# Patient Record
Sex: Female | Born: 1990 | Hispanic: Yes | Marital: Single | Smoking: Never smoker
Health system: Southern US, Community
[De-identification: ages and names within clinical notes are randomized; demographics above are authoritative.]

## PROBLEM LIST (undated history)

## (undated) ENCOUNTER — Emergency Department: Admission: EM | Discharge status: Absent without leave | Payer: PRIVATE HEALTH INSURANCE

## (undated) DIAGNOSIS — E282 Polycystic ovarian syndrome: Secondary | ICD-10-CM

## (undated) DIAGNOSIS — Z789 Other specified health status: Secondary | ICD-10-CM

## (undated) HISTORY — DX: Polycystic ovarian syndrome: E28.2

## (undated) HISTORY — PX: NO PAST SURGERIES: SHX2092

---

## 2012-06-02 ENCOUNTER — Ambulatory Visit: Payer: Self-pay | Admitting: Primary Care

## 2014-07-22 LAB — HM PAP SMEAR

## 2014-12-10 ENCOUNTER — Other Ambulatory Visit: Payer: Self-pay | Admitting: Physician Assistant

## 2014-12-10 DIAGNOSIS — Z3491 Encounter for supervision of normal pregnancy, unspecified, first trimester: Secondary | ICD-10-CM

## 2014-12-17 ENCOUNTER — Ambulatory Visit
Admission: RE | Admit: 2014-12-17 | Discharge: 2014-12-17 | Disposition: A | Discharge status: Home / Self Care | Payer: Managed Care, Other (non HMO) | Source: Ambulatory Visit | Attending: Physician Assistant | Admitting: Physician Assistant

## 2014-12-17 DIAGNOSIS — Z36 Encounter for antenatal screening of mother: Secondary | ICD-10-CM | POA: Diagnosis present

## 2014-12-17 DIAGNOSIS — Z3491 Encounter for supervision of normal pregnancy, unspecified, first trimester: Secondary | ICD-10-CM

## 2015-02-05 ENCOUNTER — Other Ambulatory Visit: Payer: Self-pay | Admitting: Primary Care

## 2015-02-05 DIAGNOSIS — Z3492 Encounter for supervision of normal pregnancy, unspecified, second trimester: Secondary | ICD-10-CM

## 2015-02-26 ENCOUNTER — Ambulatory Visit
Admission: RE | Admit: 2015-02-26 | Discharge: 2015-02-26 | Disposition: A | Discharge status: Home / Self Care | Payer: Managed Care, Other (non HMO) | Source: Ambulatory Visit | Attending: Primary Care | Admitting: Primary Care

## 2015-02-26 DIAGNOSIS — Z3492 Encounter for supervision of normal pregnancy, unspecified, second trimester: Secondary | ICD-10-CM

## 2015-02-26 DIAGNOSIS — Z36 Encounter for antenatal screening of mother: Secondary | ICD-10-CM | POA: Insufficient documentation

## 2015-06-22 NOTE — L&D Delivery Note (Signed)
Delivery Note At 10:47 AM a viable female was delivered via Vaginal, Spontaneous Delivery (Presentation:LOA ;  Nursing student Sandi Mariscal assisted  ).  APGAR: 8, 9; weight  .   Placenta status: Intact, Spontaneous.  Cord:delayed clamping   with the following complications: None.   Anesthesia: None  Episiotomy: none   Lacerations:Bilateral  Labial+2nd degree Suture Repair: 2.0 3.0 vicryl Est. Blood Loss (mL):  200  Mom to postpartum.  Baby to Couplet care / Skin to Skin.  Mane Consolo 07/19/2015, 11:20 AM

## 2015-07-19 ENCOUNTER — Encounter: Payer: Self-pay | Admitting: *Deleted

## 2015-07-19 ENCOUNTER — Inpatient Hospital Stay
Admission: EM | Admit: 2015-07-19 | Discharge: 2015-07-20 | DRG: 775 | Disposition: A | Discharge status: Home / Self Care | Payer: Managed Care, Other (non HMO) | Attending: Obstetrics and Gynecology | Admitting: Obstetrics and Gynecology

## 2015-07-19 DIAGNOSIS — Z3490 Encounter for supervision of normal pregnancy, unspecified, unspecified trimester: Secondary | ICD-10-CM

## 2015-07-19 DIAGNOSIS — O480 Post-term pregnancy: Secondary | ICD-10-CM | POA: Diagnosis present

## 2015-07-19 DIAGNOSIS — Z3A41 41 weeks gestation of pregnancy: Secondary | ICD-10-CM | POA: Diagnosis not present

## 2015-07-19 LAB — TYPE AND SCREEN
ABO/RH(D): B POS
ANTIBODY SCREEN: NEGATIVE

## 2015-07-19 LAB — CBC
HCT: 36.2 % (ref 35.0–47.0)
Hemoglobin: 12.2 g/dL (ref 12.0–16.0)
MCH: 29.4 pg (ref 26.0–34.0)
MCHC: 33.6 g/dL (ref 32.0–36.0)
MCV: 87.6 fL (ref 80.0–100.0)
Platelets: 213 10*3/uL (ref 150–440)
RBC: 4.13 MIL/uL (ref 3.80–5.20)
RDW: 15.3 % — ABNORMAL HIGH (ref 11.5–14.5)
WBC: 11 10*3/uL (ref 3.6–11.0)

## 2015-07-19 LAB — ABO/RH: ABO/RH(D): B POS

## 2015-07-19 LAB — CHLAMYDIA/NGC RT PCR (ARMC ONLY)
Chlamydia Tr: NOT DETECTED
N gonorrhoeae: NOT DETECTED

## 2015-07-19 MED ORDER — PENICILLIN G POTASSIUM 5000000 UNITS IJ SOLR
5.0000 10*6.[IU] | Freq: Once | INTRAVENOUS | Status: DC
  Filled 2015-07-19: qty 5

## 2015-07-19 MED ORDER — PRENATAL MULTIVITAMIN CH
1.0000 | ORAL_TABLET | Freq: Every day | ORAL | Status: DC
  Administered 2015-07-19 – 2015-07-20 (×2): 1 via ORAL
  Filled 2015-07-19 (×2): qty 1

## 2015-07-19 MED ORDER — WITCH HAZEL-GLYCERIN EX PADS: 1.0000 "application " | MEDICATED_PAD | CUTANEOUS | Status: DC | PRN

## 2015-07-19 MED ORDER — BENZOCAINE-MENTHOL 20-0.5 % EX AERO
1.0000 "application " | INHALATION_SPRAY | CUTANEOUS | Status: DC | PRN
  Filled 2015-07-19: qty 56

## 2015-07-19 MED ORDER — ONDANSETRON HCL 4 MG PO TABS: 4.0000 mg | ORAL_TABLET | ORAL | Status: DC | PRN

## 2015-07-19 MED ORDER — LIDOCAINE HCL (PF) 1 % IJ SOLN
30.0000 mL | INTRAMUSCULAR | Status: DC | PRN
  Filled 2015-07-19: qty 30

## 2015-07-19 MED ORDER — IBUPROFEN 600 MG PO TABS
600.0000 mg | ORAL_TABLET | Freq: Four times a day (QID) | ORAL | Status: DC
Start: 2015-07-19 — End: 2015-07-20
  Administered 2015-07-19 – 2015-07-20 (×4): 600 mg via ORAL
  Filled 2015-07-19 (×4): qty 1

## 2015-07-19 MED ORDER — ACETAMINOPHEN 325 MG PO TABS: 650.0000 mg | ORAL_TABLET | ORAL | Status: DC | PRN

## 2015-07-19 MED ORDER — SIMETHICONE 80 MG PO CHEW: 80.0000 mg | CHEWABLE_TABLET | ORAL | Status: DC | PRN

## 2015-07-19 MED ORDER — SENNOSIDES-DOCUSATE SODIUM 8.6-50 MG PO TABS
2.0000 | ORAL_TABLET | ORAL | Status: DC
  Administered 2015-07-20: 2 via ORAL
  Filled 2015-07-19: qty 2

## 2015-07-19 MED ORDER — PENICILLIN G POTASSIUM 5000000 UNITS IJ SOLR
2.5000 10*6.[IU] | INTRAMUSCULAR | Status: DC
  Filled 2015-07-19 (×5): qty 2.5

## 2015-07-19 MED ORDER — CITRIC ACID-SODIUM CITRATE 334-500 MG/5ML PO SOLN: 30.0000 mL | ORAL | Status: DC | PRN

## 2015-07-19 MED ORDER — OXYTOCIN 10 UNIT/ML IJ SOLN
INTRAMUSCULAR | Status: AC
  Filled 2015-07-19: qty 2

## 2015-07-19 MED ORDER — OXYTOCIN 40 UNITS IN LACTATED RINGERS INFUSION - SIMPLE MED
2.5000 [IU]/h | INTRAVENOUS | Status: DC
  Filled 2015-07-19: qty 1000

## 2015-07-19 MED ORDER — OXYTOCIN BOLUS FROM INFUSION
500.0000 mL | INTRAVENOUS | Status: DC
  Administered 2015-07-19: 500 mL via INTRAVENOUS

## 2015-07-19 MED ORDER — ONDANSETRON HCL 4 MG/2ML IJ SOLN: 4.0000 mg | INTRAMUSCULAR | Status: DC | PRN

## 2015-07-19 MED ORDER — BUTORPHANOL TARTRATE 1 MG/ML IJ SOLN: 1.0000 mg | INTRAMUSCULAR | Status: DC | PRN

## 2015-07-19 MED ORDER — FERROUS SULFATE 325 (65 FE) MG PO TABS
325.0000 mg | ORAL_TABLET | Freq: Two times a day (BID) | ORAL | Status: DC
Start: 2015-07-19 — End: 2015-07-20
  Administered 2015-07-20: 325 mg via ORAL
  Filled 2015-07-19: qty 1

## 2015-07-19 MED ORDER — MEASLES, MUMPS & RUBELLA VAC ~~LOC~~ INJ
0.5000 mL | INJECTION | Freq: Once | SUBCUTANEOUS | Status: AC
  Administered 2015-07-20: 0.5 mL via SUBCUTANEOUS
  Filled 2015-07-19 (×2): qty 0.5

## 2015-07-19 MED ORDER — MAGNESIUM HYDROXIDE 400 MG/5ML PO SUSP: 30.0000 mL | ORAL | Status: DC | PRN

## 2015-07-19 MED ORDER — LACTATED RINGERS IV SOLN: 500.0000 mL | INTRAVENOUS | Status: DC | PRN

## 2015-07-19 MED ORDER — LANOLIN HYDROUS EX OINT: TOPICAL_OINTMENT | CUTANEOUS | Status: DC | PRN

## 2015-07-19 MED ORDER — MISOPROSTOL 200 MCG PO TABS
ORAL_TABLET | ORAL | Status: AC
  Filled 2015-07-19: qty 4

## 2015-07-19 MED ORDER — LACTATED RINGERS IV SOLN
INTRAVENOUS | Status: DC
Start: 2015-07-19 — End: 2015-07-19

## 2015-07-19 MED ORDER — ZOLPIDEM TARTRATE 5 MG PO TABS: 5.0000 mg | ORAL_TABLET | Freq: Every evening | ORAL | Status: DC | PRN

## 2015-07-19 MED ORDER — OXYCODONE HCL 5 MG PO TABS: 10.0000 mg | ORAL_TABLET | ORAL | Status: DC | PRN

## 2015-07-19 MED ORDER — AMMONIA AROMATIC IN INHA
RESPIRATORY_TRACT | Status: AC
  Filled 2015-07-19: qty 10

## 2015-07-19 MED ORDER — DIBUCAINE 1 % RE OINT: 1.0000 "application " | TOPICAL_OINTMENT | RECTAL | Status: DC | PRN

## 2015-07-19 MED ORDER — ONDANSETRON HCL 4 MG/2ML IJ SOLN: 4.0000 mg | Freq: Four times a day (QID) | INTRAMUSCULAR | Status: DC | PRN

## 2015-07-19 MED ORDER — DIPHENHYDRAMINE HCL 25 MG PO CAPS: 25.0000 mg | ORAL_CAPSULE | Freq: Four times a day (QID) | ORAL | Status: DC | PRN

## 2015-07-19 MED ORDER — LIDOCAINE HCL (PF) 1 % IJ SOLN
INTRAMUSCULAR | Status: AC
  Filled 2015-07-19: qty 30

## 2015-07-19 MED ORDER — OXYCODONE HCL 5 MG PO TABS: 5.0000 mg | ORAL_TABLET | ORAL | Status: DC | PRN

## 2015-07-19 NOTE — Progress Notes (Signed)
Rebekah Brown is a 25 y.o. G1P0 at [redacted]w[redacted]d by LMP admitted for active labor  Subjective:  Painful ctx Objective: BP 113/74 mmHg  Pulse 94  Temp(Src) 98.2 F (36.8 C) (Oral)  Resp 14  Ht 5' (1.524 m)  Wt 169 lb (76.658 kg)  BMI 33.01 kg/m2  LMP 10/07/2014 I/O last 3 completed shifts: In: 125 [I.V.:125] Out: -  Total I/O In: 125 [I.V.:125] Out: -   FHT:  Reassuring fetal monitoring UC:   none SVE:   Dilation: 8 Effacement (%): 90 Station: 0, -1 Exam by:: JPB, LSE  Labs: Lab Results  Component Value Date   WBC 11.0 07/19/2015   HGB 12.2 07/19/2015   HCT 36.2 07/19/2015   MCV 87.6 07/19/2015   PLT 213 07/19/2015    Assessment / Plan: Spontaneous labor, progressing normally  Labor: Progressing normally Preeclampsia:  n/a Fetal Wellbeing:  Category I Pain Control:  stadol prn  I/D:  n/a Anticipated MOD:  NSVD  SCHERMERHORN,THOMAS 07/19/2015, 10:39 AM

## 2015-07-19 NOTE — H&P (Signed)
Chala Gul is a 25 y.o. female presenting for labor , CTX since 1500 . EDC 07/14/15. No ROM . No vaginal bleeding. History OB History    Gravida Para Term Preterm AB TAB SAB Ectopic Multiple Living   1              History reviewed. No pertinent past medical history. History reviewed. No pertinent past surgical history. Family History: family history is not on file. Social History:  reports that she has never smoked. She does not have any smokeless tobacco history on file. She reports that she does not drink alcohol or use illicit drugs.   Prenatal Transfer Tool  Maternal Diabetes: No Genetic Screening: no records Maternal Ultrasounds/Referrals: Normal Fetal Ultrasounds or other Referrals:  Other:  Maternal Substance Abuse:  No Significant Maternal Medications:  None Significant Maternal Lab Results:  Lab values include: Group B Strep negative By RN report Other Comments:  None  ROS    Blood pressure 127/82, pulse 101, temperature 98.1 F (36.7 C), temperature source Oral, resp. rate 16, height 5' (1.524 m), weight 169 lb (76.658 kg). Exam VSS Lungs CTA  CV RRR  abd gravid  cx 5 cm / 90 /0 vtx AROM clear FSE placed  Physical Exam  Prenatal labs: ABO, Rh:  B+ Antibody:  neg  Rubella:  Immune RPR:   Neg  HBsAg:    HIV:    GBS:   Neg , By RN report  Fetal monitoring : reassuring . CTX q 2-3 minutes   Assessment/Plan: Labor  Reassuring fetal monitoring  Anticipate SVD Stadol prn pain  Offered CLE    Ryleeann Urquiza 07/19/2015, 4:24 AM

## 2015-07-20 LAB — CBC
HEMATOCRIT: 31.8 % — AB (ref 35.0–47.0)
HEMOGLOBIN: 10.4 g/dL — AB (ref 12.0–16.0)
MCH: 29.2 pg (ref 26.0–34.0)
MCHC: 32.8 g/dL (ref 32.0–36.0)
MCV: 89 fL (ref 80.0–100.0)
Platelets: 183 10*3/uL (ref 150–440)
RBC: 3.58 MIL/uL — AB (ref 3.80–5.20)
RDW: 15.4 % — AB (ref 11.5–14.5)
WBC: 11.2 10*3/uL — AB (ref 3.6–11.0)

## 2015-07-20 LAB — RPR: RPR Ser Ql: NONREACTIVE

## 2015-07-20 MED ORDER — FERROUS SULFATE 325 (65 FE) MG PO TABS: 325.0000 mg | ORAL_TABLET | Freq: Two times a day (BID) | ORAL | Status: DC

## 2015-07-20 NOTE — Progress Notes (Signed)
Post Partum Day 1 from NSVD, uncomplicated Subjective: no complaints  Objective: Blood pressure 109/68, pulse 69, temperature 97.6 F (36.4 C), temperature source Oral, resp. rate 16, height 5' (1.524 m), weight 76.658 kg (169 lb), last menstrual period 10/07/2014, SpO2 100 %.  Physical Exam:  General: alert, cooperative and no distress Lochia: appropriate Uterine Fundus: firm Incision: none DVT Evaluation: No evidence of DVT seen on physical exam.   Recent Labs  07/19/15 0411 07/20/15 0715  HGB 12.2 10.4*  HCT 36.2 31.8*    Assessment/Plan: Discharge home and Breastfeeding   LOS: 1 day   Rebekah Brown 07/20/2015, 9:10 AM

## 2015-07-20 NOTE — Discharge Summary (Signed)
Obstetric Discharge Summary Reason for Admission: onset of labor Intrapartum Procedures: spontaneous vaginal delivery Complications-Operative and Postpartum: none HEMOGLOBIN  Date Value Ref Range Status  07/20/2015 10.4* 12.0 - 16.0 g/dL Final   HCT  Date Value Ref Range Status  07/20/2015 31.8* 35.0 - 47.0 % Final    Physical Exam:  General: alert, cooperative and no distress Lochia: appropriate Uterine Fundus: firm Incision: none DVT Evaluation: No evidence of DVT seen on physical exam.  Discharge Diagnoses: Term Pregnancy-delivered  Discharge Information: Date: 07/20/2015 Activity: pelvic rest Diet: routine Medications: PNV and Ibuprofen Condition: stable Instructions: refer to practice specific booklet Discharge to: home Follow-up Information    Follow up with SCHERMERHORN,THOMAS, MD In 6 weeks.   Specialty:  Obstetrics and Gynecology   Why:  For postpartum visit   Contact information:   [address] [place] [address] [phone]       Newborn Data: Live born female  Birth Weight: 7 lb 14.6 oz (3590 g) APGAR: 8, 9  Home with mother.  Christeen Douglas 07/20/2015, 11:38 AM

## 2015-07-20 NOTE — Discharge Instructions (Signed)
Care After Vaginal Delivery °Congratulations on your new baby!! ° °Refer to this sheet in the next few weeks. These discharge instructions provide you with information on caring for yourself after delivery. Your caregiver may also give you specific instructions. Your treatment has been planned according to the most current medical practices available, but problems sometimes occur. Call your caregiver if you have any problems or questions after you go home. ° °HOME CARE INSTRUCTIONS °· Take over-the-counter or prescription medicines only as directed by your caregiver or pharmacist. °· Do not drink alcohol, especially if you are breastfeeding or taking medicine to relieve pain. °· Do not chew or smoke tobacco. °· Do not use illegal drugs. °· Continue to use good perineal care. Good perineal care includes: °¨ Wiping your perineum from front to back. °¨ Keeping your perineum clean. °· Do not use tampons or douche until your caregiver says it is okay. °· Shower, wash your hair, and take tub baths as directed by your caregiver. °· Wear a well-fitting bra that provides breast support. °· Eat healthy foods. °· Drink enough fluids to keep your urine clear or pale yellow. °· Eat high-fiber foods such as whole grain cereals and breads, brown rice, beans, and fresh fruits and vegetables every day. These foods may help prevent or relieve constipation. °· Follow your caregiver's recommendations regarding resumption of activities such as climbing stairs, driving, lifting, exercising, or traveling. Specifically, no driving for two weeks, so that you are comfortable reacting quickly in an emergency. °· Talk to your caregiver about resuming sexual activities. Resumption of sexual activities is dependent upon your risk of infection, your rate of healing, and your comfort and desire to resume sexual activity. Usually we recommend waiting about six weeks, or until your bleeding stops and you are interested in sex. °· Try to have someone  help you with your household activities and your newborn for at least a few days after you leave the hospital. Even longer is better. °· Rest as much as possible. Try to rest or take a nap when your newborn is sleeping. Sleep deprivation can be very hard after delivery. °· Increase your activities gradually. °· Keep all of your scheduled postpartum appointments. It is very important to keep your scheduled follow-up appointments. At these appointments, your caregiver will be checking to make sure that you are healing physically and emotionally. ° °SEEK MEDICAL CARE IF:  °· You are passing large clots from your vagina.  °· You have a foul smelling discharge from your vagina. °· You have trouble urinating. °· You are urinating frequently. °· You have pain when you urinate. °· You have a change in your bowel movements. °· You have increasing redness, pain, or swelling near your vaginal incision (episiotomy) or vaginal tear. °· You have pus draining from your episiotomy or vaginal tear. °· Your episiotomy or vaginal tear is separating. °· You have painful, hard, or reddened breasts. °· You have a severe headache. °· You have blurred vision or see spots. °· You feel sad or depressed. °· You have thoughts of hurting yourself or your newborn. °· You have questions about your care, the care of your newborn, or medicines. °· You are dizzy or light-headed. °· You have a rash. °· You have nausea or vomiting. °· You were breastfeeding and have not had a menstrual period within 12 weeks after you stopped breastfeeding. °· You are not breastfeeding and have not had a menstrual period by the 12th week after delivery. °· You   have a fever. ° °SEEK IMMEDIATE MEDICAL CARE IF:  °· You have persistent pain. °· You have chest pain. °· You have shortness of breath. °· You faint. °· You have leg pain. °· You have stomach pain. °· Your vaginal bleeding saturates two or more sanitary pads in 1 hour. ° °MAKE SURE YOU:  °· Understand these  instructions. °· Will get help right away if you are not doing well or get worse. °·  °Document Released: 06/04/2000 Document Revised: 10/22/2013 Document Reviewed: 02/02/2012 ° °ExitCare® Patient Information ©2015 [employer]. This information is not intended to replace advice given to you by your health care provider. Make sure you discuss any questions you have with your health care provider. ° °

## 2015-07-20 NOTE — Progress Notes (Signed)
Patient discharge to home ambulatory.  Discharge paper work reviewed with patient.

## 2015-11-09 IMAGING — US US OB COMP LESS 14 WK
1 series · 14 of 28 positions shown · non-contrast
Comparison: None.

CLINICAL DATA: Unknown dates.

EXAM:
OBSTETRIC <14 WK US AND TRANSVAGINAL OB US
TECHNIQUE: Both transabdominal and transvaginal ultrasound examinations were
performed for complete evaluation of the gestation as well as the
maternal uterus, adnexal regions, and pelvic cul-de-sac.
Transvaginal technique was performed to assess early pregnancy.

[Series 1: us ob comp less 14 wk · 0.26mm/px · 14 of 44 slices shown]
[im 2/44]
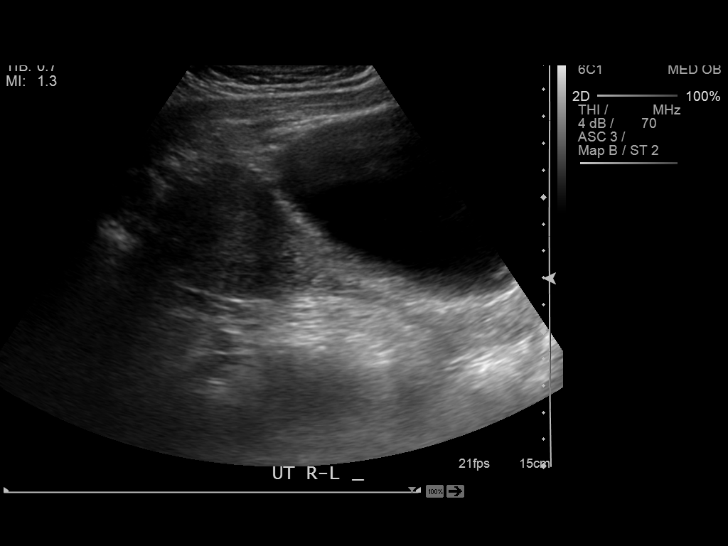
[im 5/44]
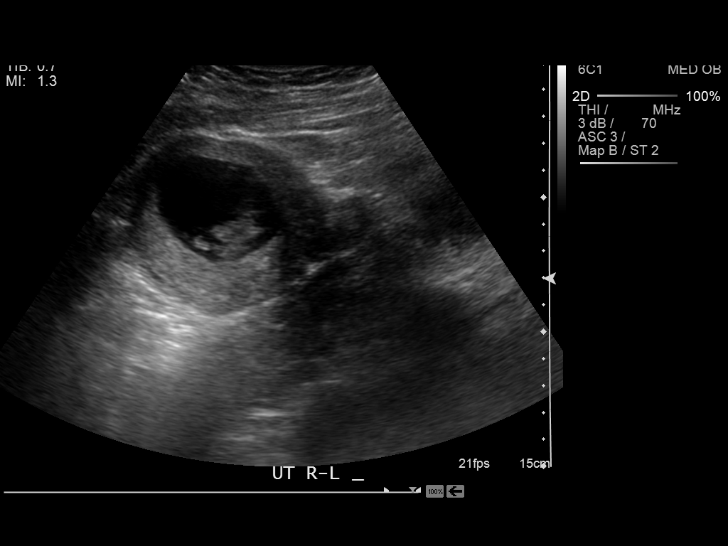
[im 8/44]
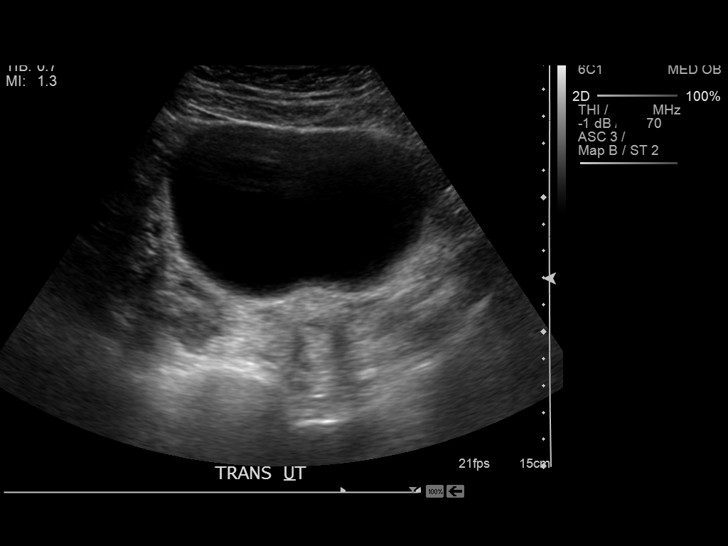
[im 12/44]
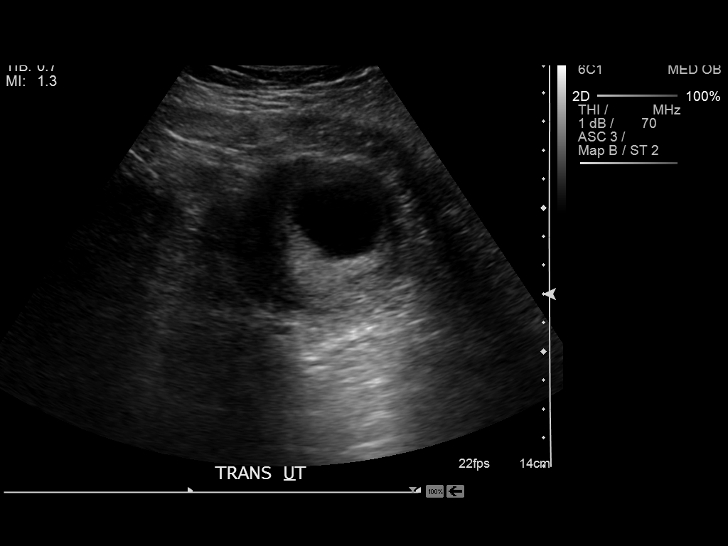
[im 15/44]
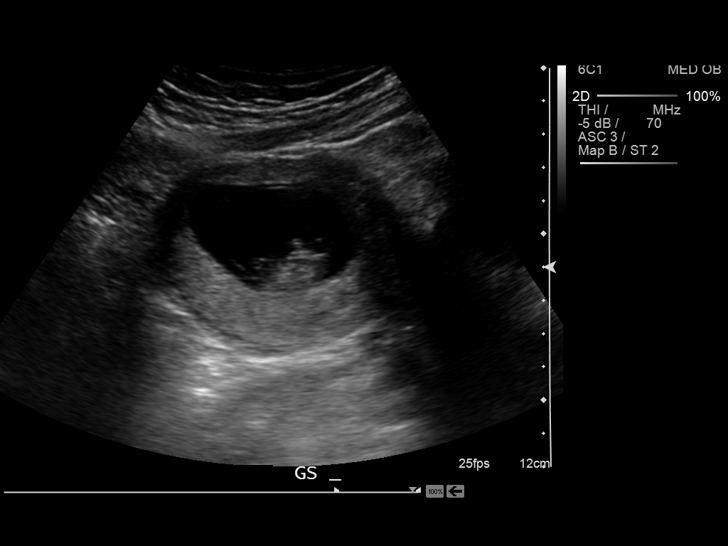
[im 18/44]
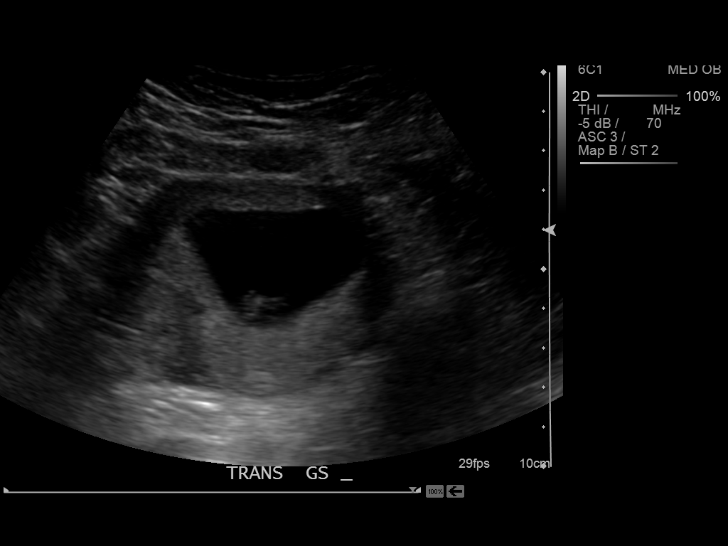
[im 21/44]
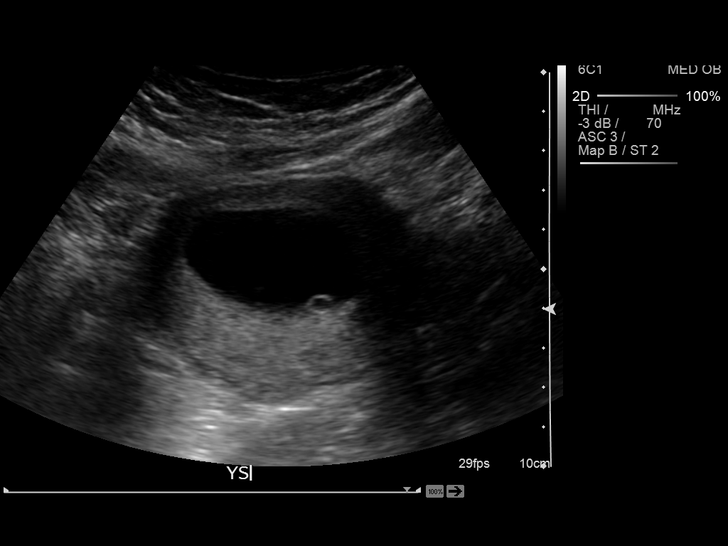
[im 24/44]
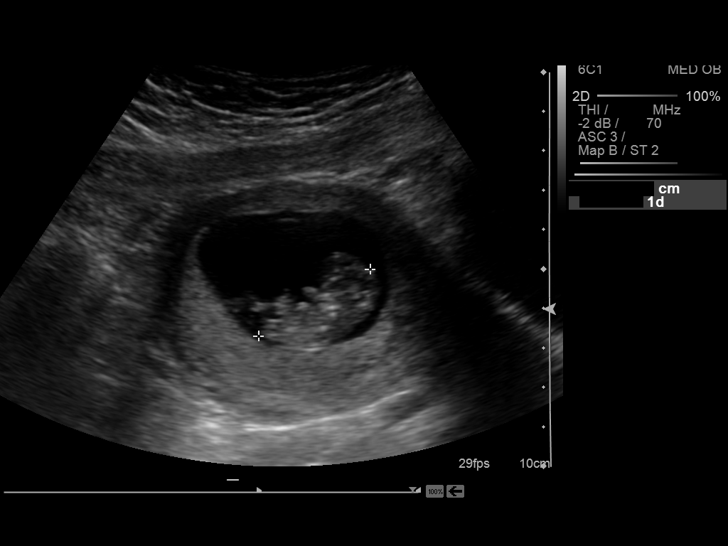
[im 28/44]
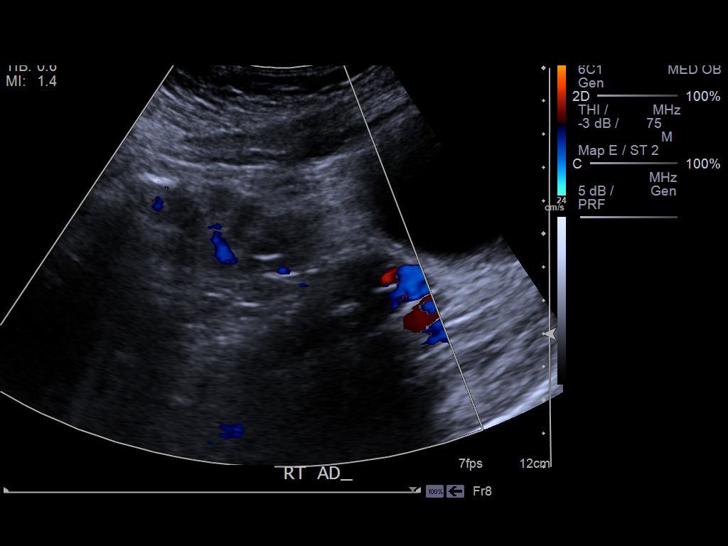
[im 31/44]
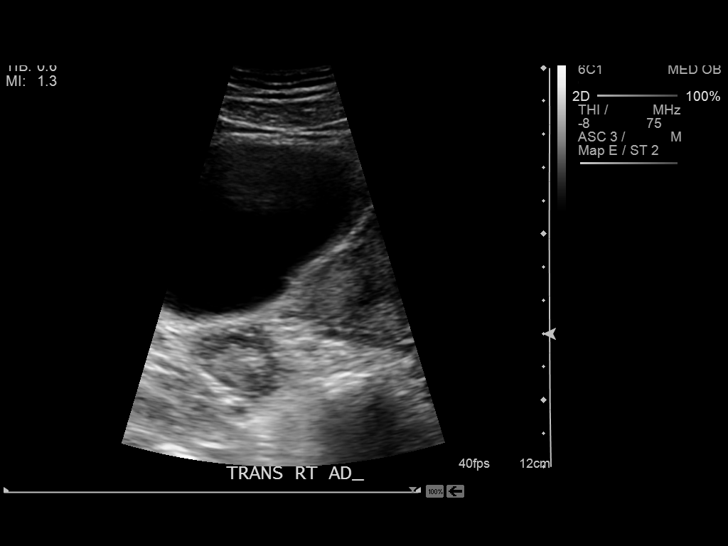
[im 34/44]
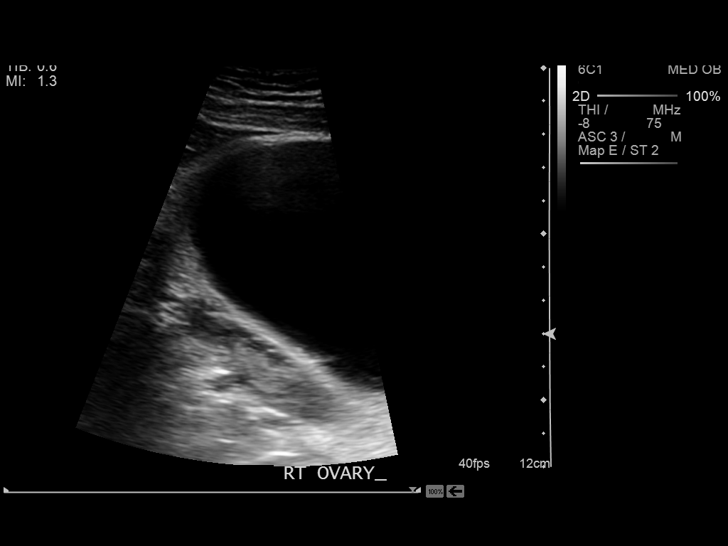
[im 37/44]
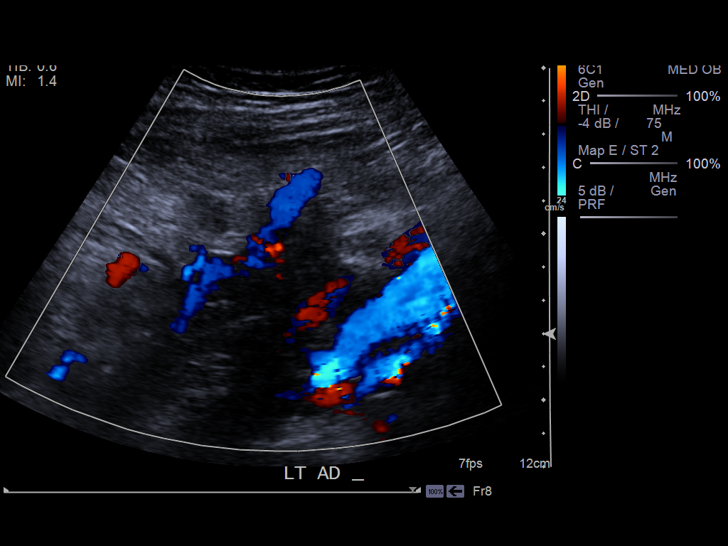
[im 40/44]
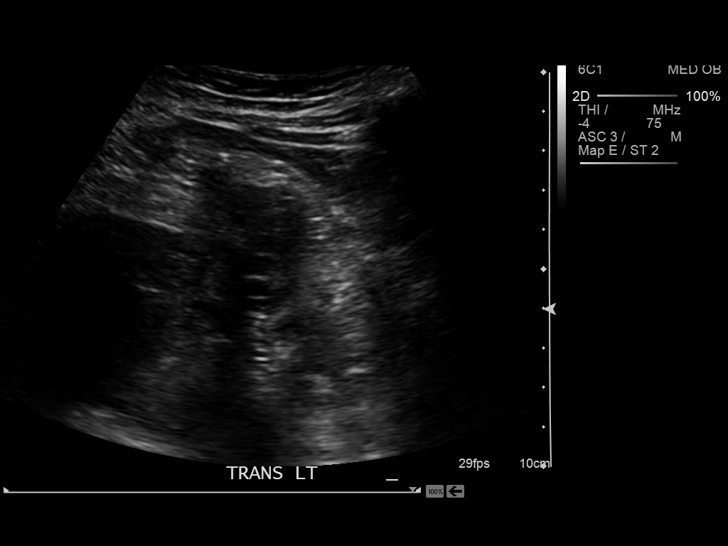
[im 44/44]
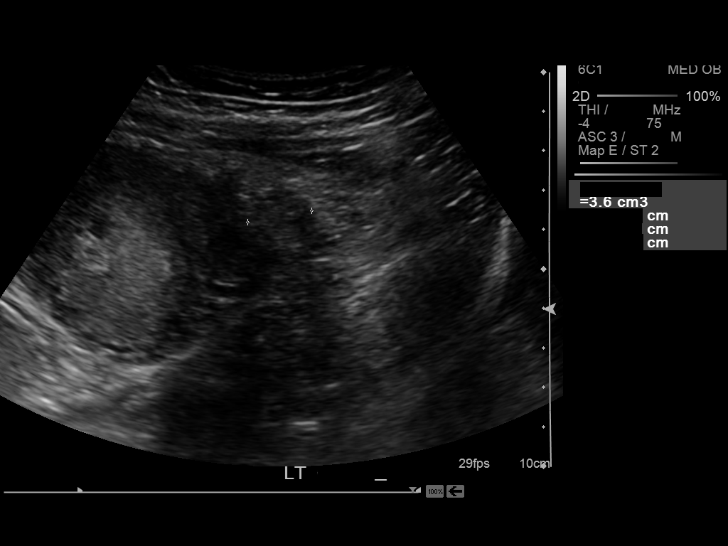

[14 of 28 positions shown; findings below may reference images not displayed]

FINDINGS: Intrauterine gestational sac: Single.

Yolk sac:  Yes

Embryo:  Yes

Cardiac Activity: Yes

Heart Rate: 169  bpm

CRL:  33  mm   10 w   1 d                  US EDC: 07/14/2015

Maternal uterus/adnexae: No subchorionic hemorrhage. Normal ovaries.
IMPRESSION: Single viable intrauterine pregnancy of approximately 10 weeks 1 day
gestation.

## 2016-05-23 ENCOUNTER — Encounter (HOSPITAL_COMMUNITY): Payer: Self-pay

## 2018-07-07 ENCOUNTER — Ambulatory Visit (INDEPENDENT_AMBULATORY_CARE_PROVIDER_SITE_OTHER): Payer: No Typology Code available for payment source | Admitting: Obstetrics and Gynecology

## 2018-07-07 ENCOUNTER — Other Ambulatory Visit (HOSPITAL_COMMUNITY)
Admission: RE | Admit: 2018-07-07 | Discharge: 2018-07-07 | Disposition: A | Discharge status: Home / Self Care | Payer: No Typology Code available for payment source | Source: Ambulatory Visit | Attending: Obstetrics and Gynecology | Admitting: Obstetrics and Gynecology

## 2018-07-07 ENCOUNTER — Encounter: Payer: Self-pay | Admitting: Obstetrics and Gynecology

## 2018-07-07 VITALS — BP 100/70 | HR 72 | Ht 60.0 in | Wt 135.0 lb

## 2018-07-07 DIAGNOSIS — N870 Mild cervical dysplasia: Secondary | ICD-10-CM

## 2018-07-07 DIAGNOSIS — R87610 Atypical squamous cells of undetermined significance on cytologic smear of cervix (ASC-US): Secondary | ICD-10-CM

## 2018-07-07 DIAGNOSIS — R87810 Cervical high risk human papillomavirus (HPV) DNA test positive: Secondary | ICD-10-CM | POA: Insufficient documentation

## 2018-07-07 NOTE — Progress Notes (Signed)
   Referring Provider:  [facility], Sandrea HughsJessica Rubio, FNP for ASCUS, HPV + pap smear  HPI:  Enid CutterDiana Viscomi is a 28 y.o.  G1P1001  who presents today for evaluation and management of abnormal cervical cytology.    Dysplasia History:  ASCUS, HPV + pap smear - 05/2018  OB History  Gravida Para Term Preterm AB Living  1 1 1     1   SAB TAB Ectopic Multiple Live Births          1    # Outcome Date GA Lbr Len/2nd Weight Sex Delivery Anes PTL Lv  1 Term             Past Medical History:  Diagnosis Date  . Polycystic ovaries     History reviewed. No pertinent surgical history.  SOCIAL HISTORY:  Social History   Substance and Sexual Activity  Alcohol Use No    Social History   Substance and Sexual Activity  Drug Use No     History reviewed. No pertinent family history.  ALLERGIES:  Patient has no known allergies.  No current outpatient medications on file prior to visit.   No current facility-administered medications on file prior to visit.     Physical Exam: -Vitals:  BP 100/70   Pulse 72   Ht 5' (1.524 m)   Wt 135 lb (61.2 kg)   Breastfeeding No   BMI 26.37 kg/m  GEN: WD, WN, NAD.  A+ O x 3, good mood and affect. ABD:  NT, ND.  Soft, no masses.  No hernias noted.   Pelvic:   Vulva: Normal appearance.  No lesions.  Vagina: No lesions or abnormalities noted.  Support: Normal pelvic support.  Urethra No masses tenderness or scarring.  Meatus Normal size without lesions or prolapse.  Cervix: See below.  Anus: Normal exam.  No lesions.  Perineum: Normal exam.  No lesions.        Bimanual   Uterus: Normal size.  Non-tender.  Mobile.  AV.  Adnexae: No masses.  Non-tender to palpation.  Cul-de-sac: Negative for abnormality.   PROCEDURE: 1.  Urine Pregnancy Test:  not done, patient using Nexplanon for contraception 2.  Colposcopy performed with 4% acetic acid after verbal consent obtained                             -Aceto-white Lesions Location(s): mild diffusely               -Biopsy performed at 5, 8, and 12 o'clock               -ECC indicated and performed: Yes.       -Biopsy sites made hemostatic with pressure and Monsel's solution   -Satisfactory colposcopy: No.    -Evidence of Invasive cervical CA :  NO  ASSESSMENT:  Enid CutterDiana Berberich is a 28 y.o. G1P1001 here for  1. ASCUS with positive high risk HPV cervical   .  PLAN:  I discussed the grading system of pap smears and HPV high risk viral types.  We will discuss and base management after colpo results return.     Thomasene MohairStephen Sadonna Kotara, MD  [facility], [facility] 07/07/2018  3:16 PM   CC: Sandrea Hughsubio, Jessica, NP [address] [place], [place] [id number]

## 2018-07-13 ENCOUNTER — Encounter: Payer: Self-pay | Admitting: Obstetrics and Gynecology

## 2018-07-13 ENCOUNTER — Telehealth: Payer: Self-pay | Admitting: Obstetrics and Gynecology

## 2018-07-13 NOTE — Telephone Encounter (Signed)
Will send letter with results.

## 2018-07-20 ENCOUNTER — Telehealth: Payer: Self-pay

## 2018-07-20 NOTE — Telephone Encounter (Signed)
Pt calling for colpo results.  [phone]

## 2018-07-25 NOTE — Telephone Encounter (Signed)
Please let SDJ know when pt returns call

## 2018-07-25 NOTE — Telephone Encounter (Signed)
Unable to leave VM due to not being set up yet. I have sent her a letter with the results on 07/13/2018.  I called [phone].

## 2018-08-01 NOTE — Telephone Encounter (Signed)
Pt has not called back yet. Do you want to try again?

## 2019-06-19 ENCOUNTER — Telehealth: Payer: Self-pay | Admitting: Obstetrics and Gynecology

## 2019-06-19 NOTE — Telephone Encounter (Signed)
Rebekah Brown referring for Colpo Ascus pap, HPV+. Phone number's on file are not in service unable to leave message.

## 2019-07-04 ENCOUNTER — Ambulatory Visit: Payer: No Typology Code available for payment source

## 2019-07-09 ENCOUNTER — Other Ambulatory Visit (HOSPITAL_COMMUNITY)
Admission: RE | Admit: 2019-07-09 | Discharge: 2019-07-09 | Disposition: A | Discharge status: Home / Self Care | Payer: PRIVATE HEALTH INSURANCE | Source: Ambulatory Visit | Attending: Obstetrics & Gynecology | Admitting: Obstetrics & Gynecology

## 2019-07-09 ENCOUNTER — Ambulatory Visit (INDEPENDENT_AMBULATORY_CARE_PROVIDER_SITE_OTHER): Payer: No Typology Code available for payment source | Admitting: Obstetrics & Gynecology

## 2019-07-09 ENCOUNTER — Other Ambulatory Visit: Payer: Self-pay

## 2019-07-09 ENCOUNTER — Encounter: Payer: Self-pay | Admitting: Obstetrics & Gynecology

## 2019-07-09 VITALS — BP 120/80 | Ht 60.0 in | Wt 147.0 lb

## 2019-07-09 DIAGNOSIS — N870 Mild cervical dysplasia: Secondary | ICD-10-CM | POA: Insufficient documentation

## 2019-07-09 NOTE — Patient Instructions (Signed)

## 2019-07-09 NOTE — Progress Notes (Signed)
Referring Provider:  [facility]  HPI:  Ceria Suminski is a 29 y.o.  G1P1001  who presents today for evaluation and management of abnormal cervical cytology.    Dysplasia History:  ASCUS +HPV 05/2018 and 05/2019   Colpo Bx 06/2018 showed CIN I in 2 /4 biopsies  ROS:  Pertinent items are noted in HPI.  OB History  Gravida Para Term Preterm AB Living  1 1 1     1   SAB TAB Ectopic Multiple Live Births          1    # Outcome Date GA Lbr Len/2nd Weight Sex Delivery Anes PTL Lv  1 Term             Past Medical History:  Diagnosis Date  . Polycystic ovaries     History reviewed. No pertinent surgical history.  SOCIAL HISTORY: Social History   Substance and Sexual Activity  Alcohol Use No   Social History   Substance and Sexual Activity  Drug Use No     History reviewed. No pertinent family history.  ALLERGIES:  Patient has no known allergies.  No current outpatient medications on file prior to visit.   No current facility-administered medications on file prior to visit.    Physical Exam: -Vitals:  BP 120/80   Ht 5' (1.524 m)   Wt 147 lb (66.7 kg)   BMI 28.71 kg/m  GEN: WD, WN, NAD.  A+ O x 3, good mood and affect. ABD:  NT, ND.  Soft, no masses.  No hernias noted.   Pelvic:   Vulva: Normal appearance.  No lesions.  Vagina: No lesions or abnormalities noted.  Support: Normal pelvic support.  Urethra No masses tenderness or scarring.  Meatus Normal size without lesions or prolapse.  Cervix: See below.  Anus: Normal exam.  No lesions.  Perineum: Normal exam.  No lesions.        Bimanual   Uterus: Normal size.  Non-tender.  Mobile.  AV.  Adnexae: No masses.  Non-tender to palpation.  Cul-de-sac: Negative for abnormality.   PROCEDURE: 1.  Urine Pregnancy Test:  not done 2.  Colposcopy performed with 4% acetic acid after verbal consent obtained                                         -Aceto-white Lesions Location(s): 3 o'clock.    -Biopsy performed at 3 o'clock               -ECC indicated and performed: Yes.       -Biopsy sites made hemostatic with pressure, AgNO3, and/or Monsel's solution   -Satisfactory colposcopy: Yes.      -Evidence of Invasive cervical CA :  NO  ASSESSMENT:  Charlot Gouin is a 29 y.o. G1P1001 here for  1. CIN I (cervical intraepithelial neoplasia I)   .  PLAN: 1.  I discussed the grading system of pap smears and HPV high risk viral types.  We will discuss and base management after colpo results return. 2. Follow up PAP 6 months, vs intervention if high grade dysplasia identified 3. Treatment of persistantly abnormal PAP smears and cervical dysplasia, even mild, is discussed w pt today in detail, as well as the pros and cons of Cryo and LEEP procedures. Will consider and discuss after results.      Barnett Applebaum, MD, [facility], [facility] 07/09/2019  3:57 PM

## 2019-07-11 LAB — SURGICAL PATHOLOGY

## 2019-08-02 ENCOUNTER — Emergency Department
Admission: EM | Admit: 2019-08-02 | Discharge: 2019-08-02 | Disposition: A | Discharge status: Home / Self Care | Payer: Worker's Compensation | Attending: Student in an Organized Health Care Education/Training Program | Admitting: Student in an Organized Health Care Education/Training Program

## 2019-08-02 ENCOUNTER — Encounter: Payer: Self-pay | Admitting: Emergency Medicine

## 2019-08-02 ENCOUNTER — Emergency Department: Payer: No Typology Code available for payment source | Attending: Physician Assistant

## 2019-08-02 ENCOUNTER — Other Ambulatory Visit: Payer: Self-pay

## 2019-08-02 DIAGNOSIS — Y9289 Other specified places as the place of occurrence of the external cause: Secondary | ICD-10-CM | POA: Insufficient documentation

## 2019-08-02 DIAGNOSIS — Y9389 Activity, other specified: Secondary | ICD-10-CM | POA: Insufficient documentation

## 2019-08-02 DIAGNOSIS — S300XXA Contusion of lower back and pelvis, initial encounter: Secondary | ICD-10-CM | POA: Insufficient documentation

## 2019-08-02 DIAGNOSIS — Y990 Civilian activity done for income or pay: Secondary | ICD-10-CM | POA: Insufficient documentation

## 2019-08-02 DIAGNOSIS — S3992XA Unspecified injury of lower back, initial encounter: Secondary | ICD-10-CM | POA: Diagnosis present

## 2019-08-02 DIAGNOSIS — W010XXA Fall on same level from slipping, tripping and stumbling without subsequent striking against object, initial encounter: Secondary | ICD-10-CM | POA: Diagnosis not present

## 2019-08-02 LAB — POCT PREGNANCY, URINE: Preg Test, Ur: NEGATIVE

## 2019-08-02 MED ORDER — TRAMADOL HCL 50 MG PO TABS: 50.0000 mg | ORAL_TABLET | Freq: Four times a day (QID) | ORAL | 0 refills | Status: AC | PRN

## 2019-08-02 MED ORDER — LIDOCAINE 5 % EX PTCH
1.0000 | MEDICATED_PATCH | CUTANEOUS | Status: DC
  Administered 2019-08-02: 14:00:00 1 via TRANSDERMAL
  Filled 2019-08-02: qty 1

## 2019-08-02 MED ORDER — IBUPROFEN 600 MG PO TABS: 600.0000 mg | ORAL_TABLET | Freq: Three times a day (TID) | ORAL | 0 refills | Status: DC | PRN

## 2019-08-02 MED ORDER — TRAMADOL HCL 50 MG PO TABS: 50.0000 mg | ORAL_TABLET | Freq: Four times a day (QID) | ORAL | 0 refills | Status: DC | PRN

## 2019-08-02 MED ORDER — TRAMADOL HCL 50 MG PO TABS
50.0000 mg | ORAL_TABLET | Freq: Four times a day (QID) | ORAL | 0 refills | Status: DC | PRN
Start: 2019-08-02 — End: 2019-08-02

## 2019-08-02 NOTE — ED Provider Notes (Signed)
[facility] Emergency Department Provider Note   ____________________________________________   First MD Initiated Contact with Patient 08/02/19 1137     (approximate)  I have reviewed the triage vital signs and the nursing notes.   HISTORY  Chief Complaint Back Pain and Fall    HPI Rebekah Brown is a 29 y.o. female patient complain of low back and coccyx pain secondary to a fall which occurred at work yesterday.  Patient she was pulled the machine and fell backwards.  Patient denies radicular component to her back pain.  Patient denies bladder or bowel dysfunction.  Patient rates the pain as 8/10.  Patient described the pain as "achy".  Patient stated moderate relief with ibuprofen, heat, and ice.         Past Medical History:  Diagnosis Date  . Polycystic ovaries     Patient Active Problem List   Diagnosis Date Noted  . Pregnant and not yet delivered 07/19/2015    History reviewed. No pertinent surgical history.  Prior to Admission medications   Medication Sig Start Date End Date Taking? Authorizing Provider  ibuprofen (ADVIL) 600 MG tablet Take 1 tablet (600 mg total) by mouth every 8 (eight) hours as needed. 08/02/19   Sable Feil, PA-C  traMADol (ULTRAM) 50 MG tablet Take 1 tablet (50 mg total) by mouth every 6 (six) hours as needed for up to 5 days. 08/02/19 08/07/19  Sable Feil, PA-C    Allergies Patient has no known allergies.  No family history on file.  Social History Social History   Tobacco Use  . Smoking status: Never Smoker  . Smokeless tobacco: Never Used  Substance Use Topics  . Alcohol use: No  . Drug use: No    Review of Systems  Constitutional: No fever/chills Eyes: No visual changes. ENT: No sore throat. Cardiovascular: Denies chest pain. Respiratory: Denies shortness of breath. Gastrointestinal: No abdominal pain.  No nausea, no vomiting.  No diarrhea.  No constipation. Genitourinary: Negative  for dysuria.  Polycystic ovaries. Musculoskeletal: Positive for back pain. Skin: Negative for rash. Neurological: Negative for headaches, focal weakness or numbness.   ____________________________________________   PHYSICAL EXAM:  VITAL SIGNS: ED Triage Vitals [08/02/19 1146]  Enc Vitals Group     BP 115/74     Pulse Rate 93     Resp 20     Temp 98.9 F (37.2 C)     Temp Source Oral     SpO2 99 %     Weight 147 lb (66.7 kg)     Height 5' (1.524 m)     Head Circumference      Peak Flow      Pain Score 8     Pain Loc      Pain Edu?      Excl. in [place]?     Constitutional: Alert and oriented. Well appearing and in no acute distress. Cardiovascular: Normal rate, regular rhythm. Grossly normal heart sounds.  Good peripheral circulation. Respiratory: Normal respiratory effort.  No retractions. Lungs CTAB. Gastrointestinal: Soft and nontender. No distention. No abdominal bruits. No CVA tenderness. Genitourinary: Deferred Musculoskeletal: No obvious lumbar spine deformity.  Patient is moderate guarding palpation L4-S1.  Decreased range of motion with flexion.  No lower extremity tenderness nor edema.  No joint effusions. Neurologic:  Normal speech and language. No gross focal neurologic deficits are appreciated. No gait instability. Skin:  Skin is warm, dry and intact. No rash noted. Psychiatric: Mood and affect  are normal. Speech and behavior are normal.  ____________________________________________   LABS (all labs ordered are listed, but only abnormal results are displayed)  Labs Reviewed  POC URINE PREG, ED  POCT PREGNANCY, URINE   ____________________________________________  EKG   ____________________________________________  RADIOLOGY  ED MD interpretation:    Official radiology report(s): DG Lumbar Spine 2-3 Views  Result Date: 08/02/2019 CLINICAL DATA:  Recent fall with low back pain, initial encounter EXAM: LUMBAR SPINE - 2 VIEW COMPARISON:  None.  FINDINGS: Six non rib-bearing lumbar vertebra are noted. The sixth is partially sacralized consistent with a transitional segment. Vertebral body height is well maintained. No anterolisthesis is noted. No soft tissue abnormality is seen. No fractures are seen. IMPRESSION: No acute abnormality noted. Electronically Signed   By: Alcide Clever M.D.   On: 08/02/2019 13:11    ____________________________________________   PROCEDURES  Procedure(s) performed (including Critical Care):  Procedures   ____________________________________________   INITIAL IMPRESSION / ASSESSMENT AND PLAN / ED COURSE  As part of my medical decision making, I reviewed the following data within the electronic MEDICAL RECORD NUMBER     Patient presents with low back pain secondary to a fall.  Discussed x-ray findings with patient.  Patient physical exam consistent with coccyx contusion.  Patient given discharge care instructions and a work note.  Patient advised take medication as directed.  Patient advised follow-up with denies of family clinic condition persist.    Rebekah Brown was evaluated in Emergency Department on 08/02/2019 for the symptoms described in the history of present illness. She was evaluated in the context of the global COVID-19 pandemic, which necessitated consideration that the patient might be at risk for infection with the SARS-CoV-2 virus that causes COVID-19. Institutional protocols and algorithms that pertain to the evaluation of patients at risk for COVID-19 are in a state of rapid change based on information released by regulatory bodies including the CDC and federal and state organizations. These policies and algorithms were followed during the patient's care in the ED.       ____________________________________________   FINAL CLINICAL IMPRESSION(S) / ED DIAGNOSES  Final diagnoses:  Contusion of coccyx, initial encounter     ED Discharge Orders         Ordered    ibuprofen (ADVIL)  600 MG tablet  Every 8 hours PRN     08/02/19 1325    traMADol (ULTRAM) 50 MG tablet  Every 6 hours PRN,   Status:  Discontinued     08/02/19 1325    traMADol (ULTRAM) 50 MG tablet  Every 6 hours PRN,   Status:  Discontinued     08/02/19 1327    traMADol (ULTRAM) 50 MG tablet  Every 6 hours PRN     08/02/19 1327           Note:  This document was prepared using Dragon voice recognition software and may include unintentional dictation errors.    Joni Reining, PA-C 08/02/19 1525    Willy Eddy, MD 08/03/19 1102

## 2019-08-02 NOTE — Discharge Instructions (Signed)
Follow discharge care instruction take medication as directed.  Be advised medication may cause drowsiness. 

## 2019-08-02 NOTE — ED Notes (Signed)
Spoke to Pt concerning WC profile. Pt stated that her management had not requested anything to be done.

## 2019-08-02 NOTE — ED Triage Notes (Signed)
Patient to ER for c/o pain to lower back after all at work yesterday. Patient states she was pulling a machine and fell backwards. Patient has been trying heat and ice, took IBU last night with minimal relief.

## 2020-01-04 ENCOUNTER — Ambulatory Visit: Payer: No Typology Code available for payment source | Admitting: Obstetrics & Gynecology

## 2020-01-07 ENCOUNTER — Ambulatory Visit: Payer: No Typology Code available for payment source | Admitting: Obstetrics & Gynecology

## 2020-06-21 NOTE — L&D Delivery Note (Signed)
Delivery Note  First Stage: Labor onset: 0200 Augmentation : Pitocin, AROM Analgesia /Anesthesia intrapartum: Epidural given AROM at 1249, moderate meconium  Second Stage: Complete dilation at 1512 Onset of pushing at 1515 FHR second stage Variable, Cat II.   Delivery of a viable female infant on 01/06/21 at 1518 by CNM; delivery of fetal head in LOA position with restitution to LOT. Loose nuchal cord x 1, reduced after delivery of shoulders. Anterior then posterior shoulders delivered easily with gentle downward traction. Baby placed on mom's chest, and attended to by peds.  Cord double clamped after cessation of pulsation, cut by Pts mother.    Third Stage: Placenta delivered spontaneously intact with 3VC @ 1523, meconium staining noted.  Placenta disposition: routine disposal Uterine tone Firm / bleeding small  No cervical, vaginal or perineal laceration identified  Anesthesia for repair: n/a Est. Blood Loss (mL): 150  Complications: none  Mom to postpartum.  Baby to Couplet care / Skin to Skin.  Newborn: Birth Weight: pending  Apgar Scores: 7/9 Feeding planned: breast and formula

## 2020-07-17 ENCOUNTER — Other Ambulatory Visit: Payer: Self-pay | Admitting: Primary Care

## 2020-07-17 DIAGNOSIS — Z3482 Encounter for supervision of other normal pregnancy, second trimester: Secondary | ICD-10-CM

## 2020-08-12 ENCOUNTER — Ambulatory Visit
Admission: RE | Admit: 2020-08-12 | Discharge: 2020-08-12 | Disposition: A | Discharge status: Home / Self Care | Payer: No Typology Code available for payment source | Source: Ambulatory Visit | Attending: Primary Care | Admitting: Primary Care

## 2020-08-12 ENCOUNTER — Other Ambulatory Visit: Payer: Self-pay

## 2020-08-12 DIAGNOSIS — Z3482 Encounter for supervision of other normal pregnancy, second trimester: Secondary | ICD-10-CM | POA: Insufficient documentation

## 2020-12-02 LAB — OB RESULTS CONSOLE GBS: GBS: NEGATIVE

## 2020-12-25 ENCOUNTER — Other Ambulatory Visit: Payer: Self-pay | Admitting: Obstetrics and Gynecology

## 2020-12-25 NOTE — Progress Notes (Signed)
Dating: EDD: 01/01/21  by LMP: 03/27/20 and c/w [place] at 19+0 wks.   G2P1 06/2015 SVD: 41wks, birthweight 7#14  Preg c/b: Chlamydia 05/2020- Neg TOC 06/2020 Iron deficiency anemia Abnormal Pap: LSIL, Colpo at [facility]  Prenatal Labs: Blood type/Rh B pos  Antibody screen neg  Rubella Immune  Varicella Immune  RPR NR  HBsAg Neg  HIV NR  GC neg  Chlamydia neg  Genetic screening Not done  1 hour GTT 131  3 hour GTT   GBS neg    Contraception: nexplanon Infant feeding: Tdap: 10/30/20 Flu: declines

## 2021-01-06 ENCOUNTER — Inpatient Hospital Stay
Admission: EM | Admit: 2021-01-06 | Discharge: 2021-01-07 | DRG: 806 | Disposition: A | Discharge status: Home / Self Care | Payer: PRIVATE HEALTH INSURANCE | Attending: Obstetrics and Gynecology | Admitting: Obstetrics and Gynecology

## 2021-01-06 ENCOUNTER — Inpatient Hospital Stay: Payer: PRIVATE HEALTH INSURANCE | Admitting: Anesthesiology

## 2021-01-06 ENCOUNTER — Other Ambulatory Visit: Payer: Self-pay

## 2021-01-06 DIAGNOSIS — D62 Acute posthemorrhagic anemia: Secondary | ICD-10-CM | POA: Diagnosis not present

## 2021-01-06 DIAGNOSIS — O9081 Anemia of the puerperium: Secondary | ICD-10-CM | POA: Diagnosis not present

## 2021-01-06 DIAGNOSIS — O480 Post-term pregnancy: Secondary | ICD-10-CM | POA: Diagnosis present

## 2021-01-06 DIAGNOSIS — O26893 Other specified pregnancy related conditions, third trimester: Secondary | ICD-10-CM | POA: Diagnosis present

## 2021-01-06 DIAGNOSIS — N852 Hypertrophy of uterus: Secondary | ICD-10-CM

## 2021-01-06 DIAGNOSIS — Z3A40 40 weeks gestation of pregnancy: Secondary | ICD-10-CM

## 2021-01-06 DIAGNOSIS — Z20822 Contact with and (suspected) exposure to covid-19: Secondary | ICD-10-CM | POA: Diagnosis present

## 2021-01-06 HISTORY — DX: Other specified health status: Z78.9

## 2021-01-06 LAB — TYPE AND SCREEN
ABO/RH(D): B POS
Antibody Screen: NEGATIVE

## 2021-01-06 LAB — RESP PANEL BY RT-PCR (FLU A&B, COVID) ARPGX2
Influenza A by PCR: NEGATIVE
Influenza B by PCR: NEGATIVE
SARS Coronavirus 2 by RT PCR: NEGATIVE

## 2021-01-06 LAB — CBC
HCT: 33.6 % — ABNORMAL LOW (ref 36.0–46.0)
Hemoglobin: 10.9 g/dL — ABNORMAL LOW (ref 12.0–15.0)
MCH: 28.3 pg (ref 26.0–34.0)
MCHC: 32.4 g/dL (ref 30.0–36.0)
MCV: 87.3 fL (ref 80.0–100.0)
Platelets: 237 10*3/uL (ref 150–400)
RBC: 3.85 MIL/uL — ABNORMAL LOW (ref 3.87–5.11)
RDW: 14.7 % (ref 11.5–15.5)
WBC: 9.7 10*3/uL (ref 4.0–10.5)
nRBC: 0 % (ref 0.0–0.2)

## 2021-01-06 LAB — PROTEIN / CREATININE RATIO, URINE
Creatinine, Urine: 18 mg/dL
Total Protein, Urine: <6 mg/dL

## 2021-01-06 LAB — COMPREHENSIVE METABOLIC PANEL
ALT: 18 U/L (ref 0–44)
AST: 21 U/L (ref 15–41)
Albumin: 3 g/dL — ABNORMAL LOW (ref 3.5–5.0)
Alkaline Phosphatase: 173 U/L — ABNORMAL HIGH (ref 38–126)
Anion gap: 10 (ref 5–15)
BUN: 8 mg/dL (ref 6–20)
CO2: 23 mmol/L (ref 22–32)
Calcium: 9.3 mg/dL (ref 8.9–10.3)
Chloride: 101 mmol/L (ref 98–111)
Creatinine, Ser: 0.71 mg/dL (ref 0.44–1.00)
GFR, Estimated: >60 mL/min (ref >60)
Glucose, Bld: 77 mg/dL (ref 70–99)
Potassium: 3.9 mmol/L (ref 3.5–5.1)
Sodium: 134 mmol/L — ABNORMAL LOW (ref 135–145)
Total Bilirubin: 0.7 mg/dL (ref 0.3–1.2)
Total Protein: 6.7 g/dL (ref 6.5–8.1)

## 2021-01-06 MED ORDER — ACETAMINOPHEN 325 MG PO TABS: 650.0000 mg | ORAL_TABLET | ORAL | Status: DC | PRN

## 2021-01-06 MED ORDER — FENTANYL 2.5 MCG/ML W/ROPIVACAINE 0.15% IN NS 100 ML EPIDURAL (ARMC)
EPIDURAL | Status: DC | PRN
  Administered 2021-01-06: 12 mL/h via EPIDURAL

## 2021-01-06 MED ORDER — FENTANYL CITRATE (PF) 100 MCG/2ML IJ SOLN: 50.0000 ug | INTRAMUSCULAR | Status: DC | PRN

## 2021-01-06 MED ORDER — BENZOCAINE-MENTHOL 20-0.5 % EX AERO
1.0000 "application " | INHALATION_SPRAY | CUTANEOUS | Status: DC | PRN
  Administered 2021-01-06: 1 via TOPICAL
  Filled 2021-01-06: qty 56

## 2021-01-06 MED ORDER — ONDANSETRON HCL 4 MG PO TABS: 4.0000 mg | ORAL_TABLET | ORAL | Status: DC | PRN

## 2021-01-06 MED ORDER — ZOLPIDEM TARTRATE 5 MG PO TABS: 5.0000 mg | ORAL_TABLET | Freq: Every evening | ORAL | Status: DC | PRN

## 2021-01-06 MED ORDER — LACTATED RINGERS IV SOLN: INTRAVENOUS | Status: DC

## 2021-01-06 MED ORDER — OXYTOCIN-SODIUM CHLORIDE 30-0.9 UT/500ML-% IV SOLN
2.5000 [IU]/h | INTRAVENOUS | Status: DC
  Administered 2021-01-06: 2.5 [IU]/h via INTRAVENOUS
  Filled 2021-01-06: qty 500

## 2021-01-06 MED ORDER — MISOPROSTOL 25 MCG QUARTER TABLET
25.0000 ug | ORAL_TABLET | ORAL | Status: DC | PRN
  Filled 2021-01-06: qty 1

## 2021-01-06 MED ORDER — ONDANSETRON HCL 4 MG/2ML IJ SOLN: 4.0000 mg | INTRAMUSCULAR | Status: DC | PRN

## 2021-01-06 MED ORDER — SENNOSIDES-DOCUSATE SODIUM 8.6-50 MG PO TABS
2.0000 | ORAL_TABLET | Freq: Every day | ORAL | Status: DC
  Administered 2021-01-07: 2 via ORAL
  Filled 2021-01-06: qty 2

## 2021-01-06 MED ORDER — LACTATED RINGERS IV SOLN: 500.0000 mL | INTRAVENOUS | Status: DC | PRN

## 2021-01-06 MED ORDER — SIMETHICONE 80 MG PO CHEW: 80.0000 mg | CHEWABLE_TABLET | ORAL | Status: DC | PRN

## 2021-01-06 MED ORDER — SOD CITRATE-CITRIC ACID 500-334 MG/5ML PO SOLN: 30.0000 mL | ORAL | Status: DC | PRN

## 2021-01-06 MED ORDER — AMMONIA AROMATIC IN INHA
RESPIRATORY_TRACT | Status: AC
  Filled 2021-01-06: qty 10

## 2021-01-06 MED ORDER — FERROUS SULFATE 325 (65 FE) MG PO TABS
325.0000 mg | ORAL_TABLET | Freq: Two times a day (BID) | ORAL | Status: DC
  Administered 2021-01-07: 325 mg via ORAL
  Filled 2021-01-06: qty 1

## 2021-01-06 MED ORDER — DIBUCAINE (PERIANAL) 1 % EX OINT: 1.0000 "application " | TOPICAL_OINTMENT | CUTANEOUS | Status: DC | PRN

## 2021-01-06 MED ORDER — MISOPROSTOL 200 MCG PO TABS
ORAL_TABLET | ORAL | Status: AC
  Filled 2021-01-06: qty 4

## 2021-01-06 MED ORDER — LIDOCAINE HCL (PF) 1 % IJ SOLN
30.0000 mL | INTRAMUSCULAR | Status: DC | PRN
  Filled 2021-01-06: qty 30

## 2021-01-06 MED ORDER — OXYTOCIN BOLUS FROM INFUSION
333.0000 mL | Freq: Once | INTRAVENOUS | Status: AC
  Administered 2021-01-06: 333 mL via INTRAVENOUS

## 2021-01-06 MED ORDER — ONDANSETRON HCL 4 MG/2ML IJ SOLN: 4.0000 mg | Freq: Four times a day (QID) | INTRAMUSCULAR | Status: DC | PRN

## 2021-01-06 MED ORDER — DIPHENHYDRAMINE HCL 25 MG PO CAPS: 25.0000 mg | ORAL_CAPSULE | Freq: Four times a day (QID) | ORAL | Status: DC | PRN

## 2021-01-06 MED ORDER — TERBUTALINE SULFATE 1 MG/ML IJ SOLN: 0.2500 mg | Freq: Once | INTRAMUSCULAR | Status: DC | PRN

## 2021-01-06 MED ORDER — BUPIVACAINE HCL (PF) 0.25 % IJ SOLN
INTRAMUSCULAR | Status: DC | PRN
  Administered 2021-01-06: 2 mL via EPIDURAL
  Administered 2021-01-06: 4 mL via EPIDURAL

## 2021-01-06 MED ORDER — LIDOCAINE HCL (PF) 1 % IJ SOLN
INTRAMUSCULAR | Status: DC | PRN
  Administered 2021-01-06: 3 mL

## 2021-01-06 MED ORDER — OXYTOCIN-SODIUM CHLORIDE 30-0.9 UT/500ML-% IV SOLN
1.0000 m[IU]/min | INTRAVENOUS | Status: DC
  Administered 2021-01-06: 2 m[IU]/min via INTRAVENOUS
  Filled 2021-01-06: qty 500

## 2021-01-06 MED ORDER — WITCH HAZEL-GLYCERIN EX PADS: 1.0000 "application " | MEDICATED_PAD | CUTANEOUS | Status: DC | PRN

## 2021-01-06 MED ORDER — LIDOCAINE-EPINEPHRINE (PF) 1.5 %-1:200000 IJ SOLN
INTRAMUSCULAR | Status: DC | PRN
Start: 2021-01-06 — End: 2021-01-06
  Administered 2021-01-06: 3 mL via PERINEURAL

## 2021-01-06 MED ORDER — FENTANYL 2.5 MCG/ML W/ROPIVACAINE 0.15% IN NS 100 ML EPIDURAL (ARMC)
EPIDURAL | Status: AC
  Filled 2021-01-06: qty 100

## 2021-01-06 MED ORDER — OXYTOCIN 10 UNIT/ML IJ SOLN
INTRAMUSCULAR | Status: AC
  Filled 2021-01-06: qty 2

## 2021-01-06 MED ORDER — PRENATAL MULTIVITAMIN CH
1.0000 | ORAL_TABLET | Freq: Every day | ORAL | Status: DC
  Administered 2021-01-07: 1 via ORAL
  Filled 2021-01-06: qty 1

## 2021-01-06 MED ORDER — IBUPROFEN 600 MG PO TABS
600.0000 mg | ORAL_TABLET | Freq: Four times a day (QID) | ORAL | Status: DC
  Administered 2021-01-06 – 2021-01-07 (×4): 600 mg via ORAL
  Filled 2021-01-06 (×4): qty 1

## 2021-01-06 MED ORDER — COCONUT OIL OIL: 1.0000 "application " | TOPICAL_OIL | Status: DC | PRN

## 2021-01-06 NOTE — Discharge Summary (Signed)
Obstetrical Discharge Summary  Patient Name: Rebekah Brown DOB: 1990/09/29 MRN: [id number]  Date of Admission: 01/06/2021 Date of Delivery: 01/06/21 Delivered by: Heloise Ochoa CNM Date of Discharge: 01/07/2021  Primary OB: [facility]  LMP:No LMP recorded. EDC Estimated Date of Delivery: 01/01/21 Gestational Age at Delivery: [redacted]w[redacted]d   Antepartum complications:  Chlamydia 05/2020- Neg TOC 06/2020 Iron deficiency anemia Abnormal Pap: LSIL, Colpo at [facility]  Admitting Diagnosis: Labor, preg 40-42wks Secondary Diagnosis: Meconium stained fluid, SVD  Patient Active Problem List   Diagnosis Date Noted   Post-term pregnancy, 40-42 weeks of gestation 01/06/2021   Pregnant and not yet delivered 07/19/2015    Augmentation: AROM and Pitocin Complications: None Intrapartum complications/course: see delivery note Date of Delivery: 01/06/21 Delivered By: Heloise Ochoa CNM Delivery Type: spontaneous vaginal delivery Anesthesia: epidural Placenta: spontaneous Laceration: none Episiotomy: none Newborn Data: Live born female  Birth Weight:  6lbs 12 oz APGAR: 7, 9  Newborn Delivery   Birth date/time: 01/06/2021 15:18:00 Delivery type: Vaginal, Spontaneous      Postpartum Procedures: None  Edinburgh:  Edinburgh Postnatal Depression Scale Screening Tool 01/07/2021 01/06/2021  I have been able to laugh and see the funny side of things. 0 (No Data)  I have looked forward with enjoyment to things. 0 -  I have blamed myself unnecessarily when things went wrong. 0 -  I have been anxious or worried for no good reason. 1 -  I have felt scared or panicky for no good reason. 0 -  Things have been getting on top of me. 0 -  I have been so unhappy that I have had difficulty sleeping. 0 -  I have felt sad or miserable. 0 -  I have been so unhappy that I have been crying. 0 -  The thought of harming myself has occurred to me. 0 -  Edinburgh Postnatal Depression Scale Total 1 -      Post partum  course:  Patient had an uncomplicated postpartum course.  By time of discharge on PPD#1, her pain was controlled on oral pain medications; she had appropriate lochia and was ambulating, voiding without difficulty and tolerating regular diet.  She was deemed stable for discharge to home.     Discharge Physical Exam:  BP 110/76 (BP Location: Right Arm)   Pulse 87   Temp 98.6 F (37 C) (Oral)   Resp 18   Ht 5' (1.524 m)   Wt 73.9 kg   SpO2 98%   Breastfeeding Unknown   BMI 31.83 kg/m   General: NAD CV: RRR Pulm: CTABL, nl effort ABD: s/nd/nt, fundus firm and below the umbilicus Lochia: moderate Perineum: well approximated/intact DVT Evaluation: LE non-ttp, no evidence of DVT on exam.  Hemoglobin  Date Value Ref Range Status  01/07/2021 9.8 (L) 12.0 - 15.0 g/dL Final   HCT  Date Value Ref Range Status  01/07/2021 30.0 (L) 36.0 - 46.0 % Final     Disposition: stable, discharge to home. Baby Feeding: breastmilk and formula Baby Disposition: home with mom  Rh Immune globulin given: n/a Rubella vaccine given: immune Varicella vaccine given: immune Tdap vaccine given in AP or PP setting: 10/30/20 Flu vaccine given in AP or PP setting: declines  Contraception:  Nexplanon  Prenatal Labs:  Blood type/Rh B pos  Antibody screen neg  Rubella Immune  Varicella Immune  RPR NR  HBsAg Neg  HIV NR  GC neg  Chlamydia neg  Genetic screening Not done  1 hour GTT 131  3  hour GTT    GBS neg     Plan:  Rebekah Brown was discharged to home in good condition. Follow-up appointment with delivering provider in 6 weeks. May opt to see prenatal provider for postpartum visit if preferred.   Discharge Medications: Allergies as of 01/07/2021   No Known Allergies      Medication List     TAKE these medications    acetaminophen 325 MG tablet Commonly known as: Tylenol Take 2 tablets (650 mg total) by mouth every 4 (four) hours as needed for mild pain (for pain scale <  4).   ibuprofen 600 MG tablet Commonly known as: ADVIL Take 1 tablet (600 mg total) by mouth every 8 (eight) hours as needed for cramping or mild pain. What changed: reasons to take this         Follow-up Information     McVey, Prudencio Pair, CNM Follow up in 6 week(s).   Specialty: Obstetrics and Gynecology Why: may opt for postpartum visit at [facility] if desired. Call [facility] for birth control placement. Contact information: [address] [place] [address] [phone]                 Signed:  Gustavo Lah, CNM 01/07/2021  4:56 PM  Margaretmary Eddy, CNM Certified Nurse Midwife [facility]

## 2021-01-06 NOTE — Anesthesia Procedure Notes (Addendum)
Epidural Patient location during procedure: OB Start time: 01/06/2021 2:24 PM End time: 01/06/2021 2:36 PM  Staffing Anesthesiologist: Alver Fisher, MD Resident/CRNA: Irving Burton, CRNA Performed: resident/CRNA   Preanesthetic Checklist Completed: patient identified, IV checked, site marked, risks and benefits discussed, surgical consent, monitors and equipment checked, pre-op evaluation and timeout performed  Epidural Patient position: sitting Prep: ChloraPrep Patient monitoring: heart rate, continuous pulse ox and blood pressure Approach: midline Location: L3-L4 Injection technique: LOR saline  Needle:  Needle type: Tuohy  Needle gauge: 17 G Needle length: 9 cm and 9 Needle insertion depth: 5 cm Catheter type: closed end flexible Catheter size: 19 Gauge Catheter at skin depth: 10 cm Test dose: negative and 1.5% lidocaine with Epi 1:200 K  Assessment Sensory level: T10 Events: blood not aspirated, injection not painful, no injection resistance, no paresthesia and negative IV test  Additional Notes 1 attempt Pt. Evaluated and documentation done after procedure finished. Patient identified. Risks/Benefits/Options discussed with patient including but not limited to bleeding, infection, nerve damage, paralysis, failed block, incomplete pain control, headache, blood pressure changes, nausea, vomiting, reactions to medication both or allergic, itching and postpartum back pain. Confirmed with bedside nurse the patient's most recent platelet count. Confirmed with patient that they are not currently taking any anticoagulation, have any bleeding history or any family history of bleeding disorders. Patient expressed understanding and wished to proceed. All questions were answered. Sterile technique was used throughout the entire procedure. Please see nursing notes for vital signs. Test dose was given through epidural catheter and negative prior to continuing to dose epidural or start  infusion. Warning signs of high block given to the patient including shortness of breath, tingling/numbness in hands, complete motor block, or any concerning symptoms with instructions to call for help. Patient was given instructions on fall risk and not to get out of bed. All questions and concerns addressed with instructions to call with any issues or inadequate analgesia.    Patient tolerated the insertion well without immediate complications.Reason for block:procedure for pain

## 2021-01-06 NOTE — OB Triage Note (Signed)
Pt presented to L/D triage for intermittent abdominal cramping. Pt reports mild cramping with no bleeding or LOF and positive fetal movement.  SVE 3/50/-2. Monitors applied and assessing- FHT 140. BP 128/93. Pt scheduled for IOL tonight. CNM order to keep and induce pt. Pt verbalized understanding of plan.

## 2021-01-06 NOTE — H&P (Signed)
OB History & Physical   History of Present Illness:  Chief Complaint: cramping intermittently  HPI:  Rebekah Brown is a 30 y.o. G2P1001 female at [redacted]w[redacted]d dated by LMP: 03/27/20 and c/w [place] at 19+0 wks..  She presents to L&D for intermittent contractions and unsure of her scheduled IOL time.   Active FM, denies LOF or VB. Irreg UCs on admission, now feeling more painful every 2-3min.   Pregnancy Issues: Chlamydia 05/2020- Neg TOC 06/2020 Iron deficiency anemia Abnormal Pap: LSIL, Colpo at [facility]   Maternal Medical History:   Past Medical History:  Diagnosis Date   Medical history non-contributory    Polycystic ovaries     Past Surgical History:  Procedure Laterality Date   NO PAST SURGERIES      No Known Allergies  Prior to Admission medications   Medication Sig Start Date End Date Taking? Authorizing Provider  ibuprofen (ADVIL) 600 MG tablet Take 1 tablet (600 mg total) by mouth every 8 (eight) hours as needed. Patient not taking: Reported on 01/06/2021 08/02/19   Joni Reining, PA-C     [facility]renatal care site: [facility]  Social History: She  reports that she has never smoked. She has never used smokeless tobacco. She reports that she does not drink alcohol and does not use drugs.  Family History: family history is not on file.   Review of Systems: A full review of systems was performed and negative except as noted in the HPI.     Physical Exam:  Vital Signs: BP (!) 128/93 (BP Location: Left Arm)   Pulse (!) 107   Temp 98.7 F (37.1 C) (Oral)   Resp 18   Ht 5' (1.524 m)   Wt 73.9 kg   BMI 31.83 kg/m  General: no acute distress.  HEENT: normocephalic, atraumatic Heart: regular rate & rhythm.  No murmurs/rubs/gallops Lungs: clear to auscultation bilaterally, normal respiratory effort Abdomen: soft, gravid, non-tender;  EFW: 7lbs Pelvic:   External: Normal external female genitalia  Cervix: Dilation: 4 / Effacement (%): 50 / Station: -2    Extremities:  non-tender, symmetric, no edema bilaterally.  DTRs: 2+  Neurologic: Alert & oriented x 3.    Results for orders placed or performed during the hospital encounter of 01/06/21 (from the past 24 hour(s))  Resp Panel by RT-PCR (Flu A&B, Covid) Nasopharyngeal Swab     Status: None   Collection Time: 01/06/21 11:02 AM   Specimen: Nasopharyngeal Swab; Nasopharyngeal(NP) swabs in vial transport medium  Result Value Ref Range   SARS Coronavirus 2 by RT PCR NEGATIVE NEGATIVE   Influenza A by PCR NEGATIVE NEGATIVE   Influenza B by PCR NEGATIVE NEGATIVE    Pertinent Results:  Prenatal Labs: Blood type/Rh B pos  Antibody screen neg  Rubella Immune  Varicella Immune  RPR NR  HBsAg Neg  HIV NR  GC neg  Chlamydia neg  Genetic screening Not done  1 hour GTT 131  3 hour GTT    GBS neg   FHT: 140bpm, mod variability, + accels, no decels TOCO: q3-40min, Pitocin at 53mu/min SVE:  Dilation: 4 / Effacement (%): 50 / Station: -2  - AROM performed, mod amount of meconium stained fluid.    Cephalic by leopolds  No results found.  Assessment:  Rebekah Brown is a 30 y.o. G2P1001 female at [redacted]w[redacted]d with late term pregnancy.   Plan:  1. Admit to Labor & Delivery; consents reviewed and obtained - scheduled IOL was for tonight, will keep and  IOL now due to late term with contractions.  - d/w Dr Feliberto Gottron - elevated BP noted, CMP and urine P/C added on.   2. Fetal Well being  - Fetal Tracing: Cat I - Group B Streptococcus ppx indicated: negative - Presentation: cephalic confirmed by exam/Leopolds   3. Routine OB: - Prenatal labs reviewed, as above - Rh B Pos - CBC, T&S, RPR on admit - Clear fluids, IVF  4. Induction of Labor -  Contractions: external toco in place -  Pelvis proven to 7lbs -  Plan for induction with Pitocin, AROM -  Plan for continuous fetal monitoring  -  Maternal pain control as desired - Anticipate vaginal delivery  5. Post Partum Planning: - Contraception:  nexplanon - Infant feeding: - Tdap: 10/30/20 - Flu: declines  Prudencio Pair Zaylon Bossier, CNM 01/06/21 12:55 PM

## 2021-01-06 NOTE — Anesthesia Preprocedure Evaluation (Signed)
Anesthesia Evaluation  Patient identified by MRN, date of birth, ID band Patient awake    Reviewed: Allergy & Precautions, H&P , NPO status , Patient's Chart, lab work & pertinent test results  History of Anesthesia Complications Negative for: history of anesthetic complications  Airway Mallampati: II   Neck ROM: full    Dental no notable dental hx.    Pulmonary           Cardiovascular      Neuro/Psych    GI/Hepatic   Endo/Other    Renal/GU      Musculoskeletal   Abdominal   Peds  Hematology   Anesthesia Other Findings   Reproductive/Obstetrics (+) Pregnancy                             Anesthesia Physical Anesthesia Plan  ASA: 2  Anesthesia Plan: Epidural   Post-op Pain Management:    Induction:   PONV Risk Score and Plan:   Airway Management Planned:   Additional Equipment:   Intra-op Plan:   Post-operative Plan:   Informed Consent: I have reviewed the patients History and Physical, chart, labs and discussed the procedure including the risks, benefits and alternatives for the proposed anesthesia with the patient or authorized representative who has indicated his/her understanding and acceptance.       Plan Discussed with: Anesthesiologist  Anesthesia Plan Comments:         Anesthesia Quick Evaluation

## 2021-01-07 LAB — CBC
HCT: 30 % — ABNORMAL LOW (ref 36.0–46.0)
Hemoglobin: 9.8 g/dL — ABNORMAL LOW (ref 12.0–15.0)
MCH: 27.7 pg (ref 26.0–34.0)
MCHC: 32.7 g/dL (ref 30.0–36.0)
MCV: 84.7 fL (ref 80.0–100.0)
Platelets: 204 10*3/uL (ref 150–400)
RBC: 3.54 MIL/uL — ABNORMAL LOW (ref 3.87–5.11)
RDW: 14.7 % (ref 11.5–15.5)
WBC: 11.7 10*3/uL — ABNORMAL HIGH (ref 4.0–10.5)
nRBC: 0 % (ref 0.0–0.2)

## 2021-01-07 LAB — RPR: RPR Ser Ql: NONREACTIVE

## 2021-01-07 MED ORDER — IBUPROFEN 600 MG PO TABS: 600.0000 mg | ORAL_TABLET | Freq: Three times a day (TID) | ORAL | 0 refills | Status: AC | PRN

## 2021-01-07 MED ORDER — ACETAMINOPHEN 325 MG PO TABS: 650.0000 mg | ORAL_TABLET | ORAL | Status: AC | PRN

## 2021-01-07 NOTE — Progress Notes (Signed)
Pt discharged with infant. Discharge instructions, prescriptions, and follow up appointments given to and reviewed with patient. Pt verbalized understanding. To be escorted out by auxillary.  °

## 2021-01-07 NOTE — Anesthesia Postprocedure Evaluation (Signed)
Anesthesia Post Note  Patient: Rebekah Brown  Procedure(s) Performed: AN AD HOC LABOR EPIDURAL  Patient location during evaluation: Mother Baby Anesthesia Type: Epidural Level of consciousness: awake and alert Pain management: pain level controlled Vital Signs Assessment: post-procedure vital signs reviewed and stable Respiratory status: spontaneous breathing, nonlabored ventilation and respiratory function stable Cardiovascular status: stable Postop Assessment: no headache, no backache and epidural receding Anesthetic complications: no   No notable events documented.   Last Vitals:  Vitals:   01/07/21 0441 01/07/21 0840  BP: (!) 127/92 120/84  Pulse: 70 66  Resp: 20 20  Temp: 36.8 C 36.8 C  SpO2: 97% 98%    Last Pain:  Vitals:   01/07/21 0840  TempSrc: Oral  PainSc:                  [name]

## 2021-01-07 NOTE — Progress Notes (Signed)
Postpartum Day  1  Subjective: no complaints, up ad lib, voiding, and tolerating PO  Doing well, no concerns. Ambulating without difficulty, pain managed with PO meds, tolerating regular diet, and voiding without difficulty.   No fever/chills, chest pain, shortness of breath, nausea/vomiting, or leg pain. No nipple or breast pain. No headache, visual changes, or RUQ/epigastric pain.  Objective: BP (!) 127/92 (BP Location: Left Arm)   Pulse 70   Temp 98.3 F (36.8 C) (Oral)   Resp 20   Ht 5' (1.524 m)   Wt 73.9 kg   SpO2 97%   BMI 31.83 kg/m    Physical Exam:  General: alert, cooperative, and no distress Breasts: soft/nontender CV: RRR Pulm: nl effort, CTABL Abdomen: soft, non-tender, active bowel sounds Uterine Fundus: firm, 2/U, midline  Perineum: minimal edema, intact Lochia: appropriate DVT Evaluation: No evidence of DVT seen on physical exam.  Recent Labs    01/06/21 1102 01/07/21 0419  HGB 10.9* 9.8*  HCT 33.6* 30.0*  WBC 9.7 11.7*  PLT 237 204    Assessment/Plan: 30 y.o. G2P1001 postpartum day # 1  -Continue routine postpartum care -Lactation consult PRN for breastfeeding  -Discussed contraceptive options including implant, IUDs hormonal and non-hormonal, injection, pills/ring/patch, condoms, and NFP. Desires Nexplanon -Acute blood loss anemia - hemodynamically stable and asymptomatic; start PO ferrous sulfate BID with stool softeners  -Immunization status:   all immunizations up to date -Fundus is midline but 2-3 FB above umbilicus and nontender.  Bladder scanner was negative for retained urine.  Her bleeding is minimal and is not having any clots or a slow trickle.  Will discuss assessment with Dr. Dalbert Garnet.    Disposition: Continue inpatient postpartum care. Desires discharge home today   LOS: 1 day   Gustavo Lah, [place] 01/07/2021, 8:20 AM   ----- Margaretmary Eddy  Certified Nurse Midwife [facility]

## 2021-01-07 NOTE — Lactation Note (Signed)
This note was copied from a baby's chart. Lactation Consultation Note  Patient Name: Rebekah Brown MRAJH'H Date: 01/07/2021 Reason for consult: Initial assessment;Term Age:30 hours  Initial lactation visit. This is mom's second baby, and plans breast and formula. Mom did not BF her first for very long- states that the baby didn't like it.  Mom wants to do formula in preparation for returning to work, does not wish to pump. Grandmother will be keeping the baby.  Reviewed with mom newborn stomach size, early cues, signs of fullness and how to tell baby is getting enough. Encouraged on demand feedings to help build appropriate supply for baby, tips for both breast and bottle and balance.  Information for breast fullness/engorgement and management of both and nipple care.  Praised mom for Bfing in the early days, and the benefits that this will have for both mom and baby.  Lactation name/number is on the whiteboard; mom encouraged to call with questions, concerns, or for assistance.  Maternal Data Does the patient have breastfeeding experience prior to this delivery?: Yes How long did the patient breastfeed?: 1-2 days  Feeding Mother's Current Feeding Choice: Breast Milk and Formula  LATCH Score                    Lactation Tools Discussed/Used    Interventions Interventions: Education;Breast feeding basics reviewed  Discharge Discharge Education: Engorgement and breast care;Warning signs for feeding baby;Outpatient recommendation  Consult Status Consult Status: Complete    Danford Bad 01/07/2021, 10:29 AM

## 2021-11-06 IMAGING — US US OB COMP +14 WK
1 series · 13 of 28 positions shown · non-contrast
Comparison: none

CLINICAL DATA: Pregnancy.  Assess fetal anatomy.

EXAM:
OBSTETRICAL ULTRASOUND >14 WKS

[Series 1: us ob comp +14 wk · 0.23mm/px · 13 of 96 slices shown]
[im 4/96]
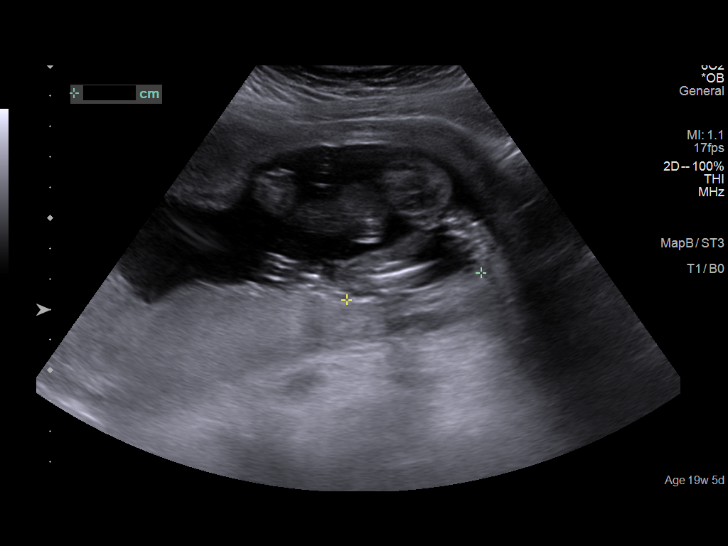
[im 11/96]
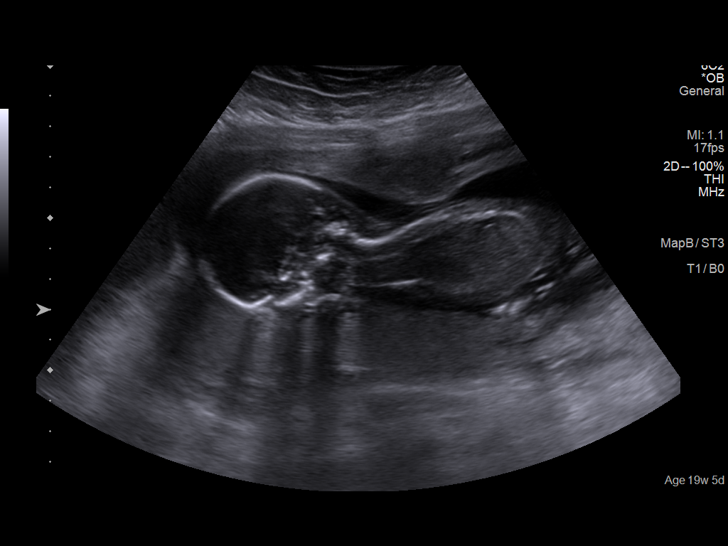
[im 18/96]
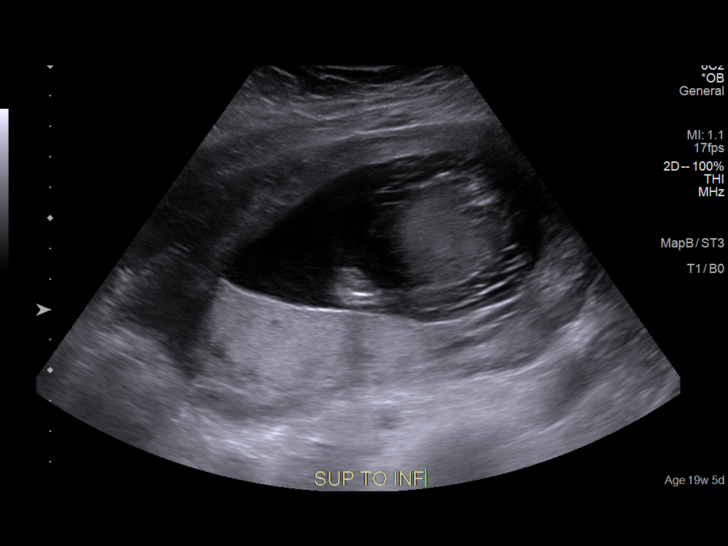
[im 25/96]
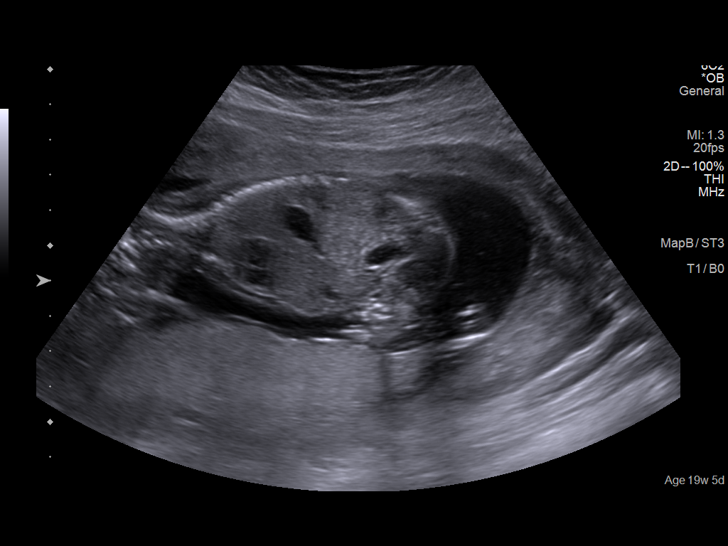
[im 32/96]
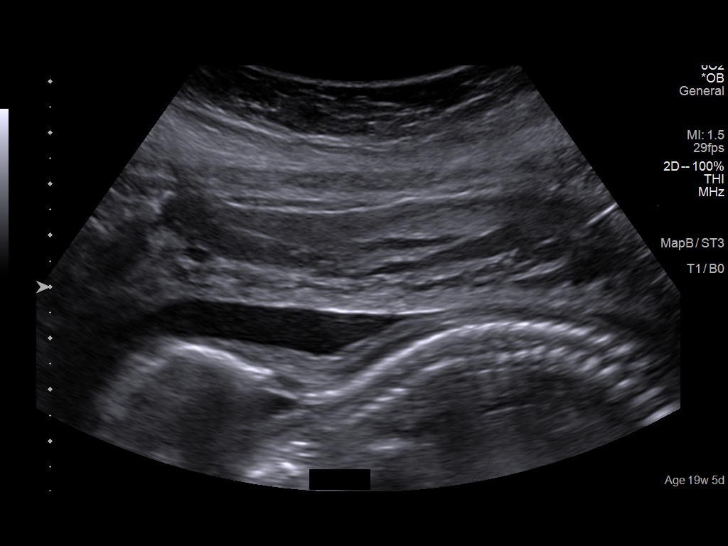
[im 39/96]
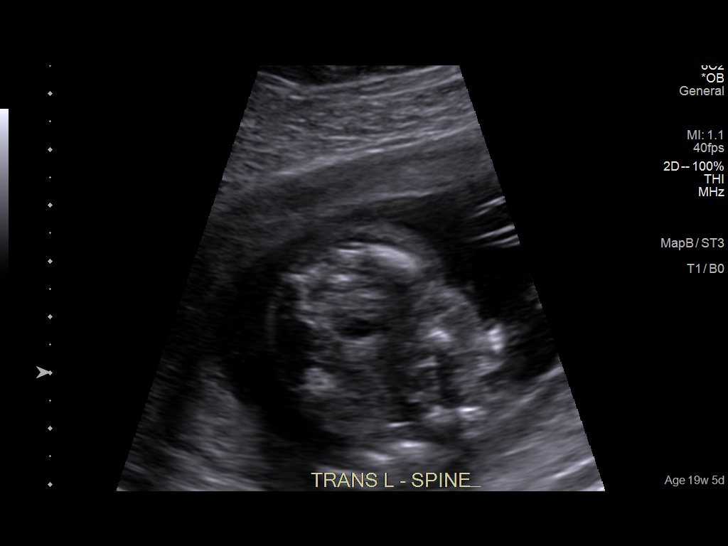
[im 50/96]
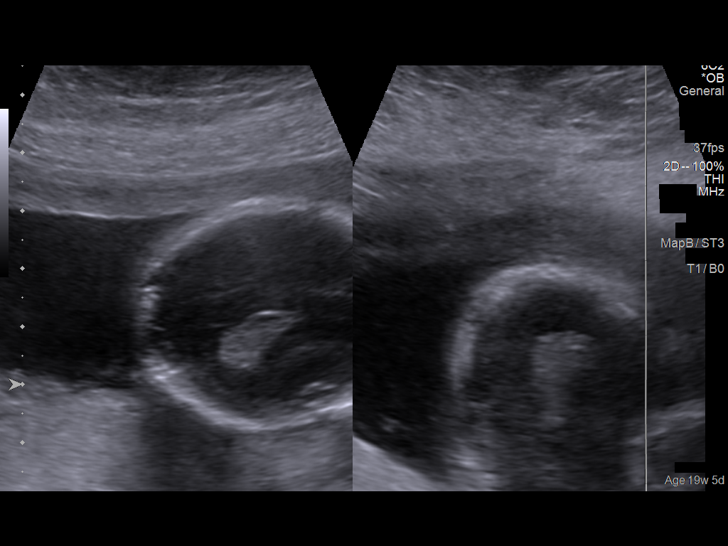
[im 57/96]
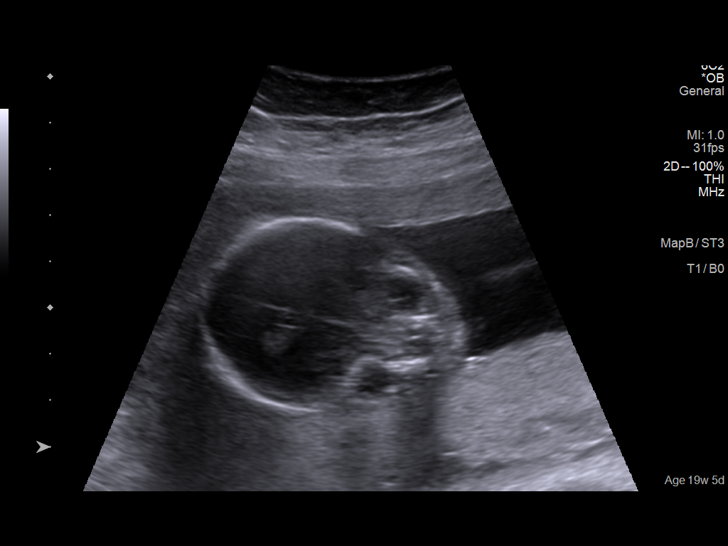
[im 64/96]
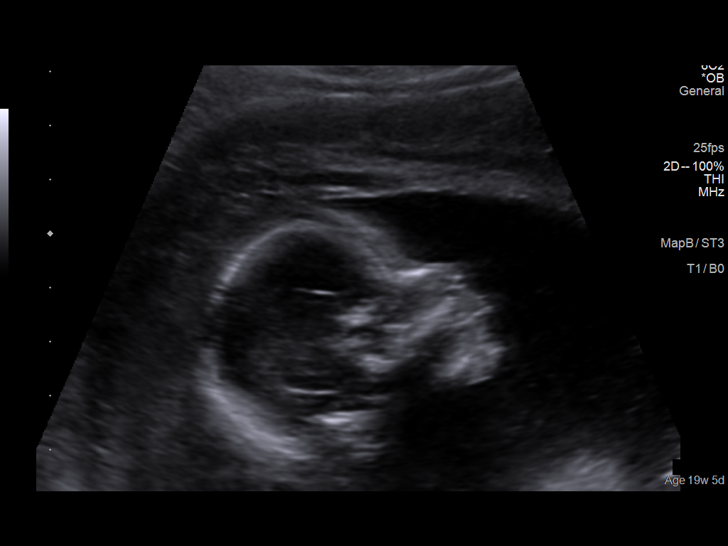
[im 71/96]
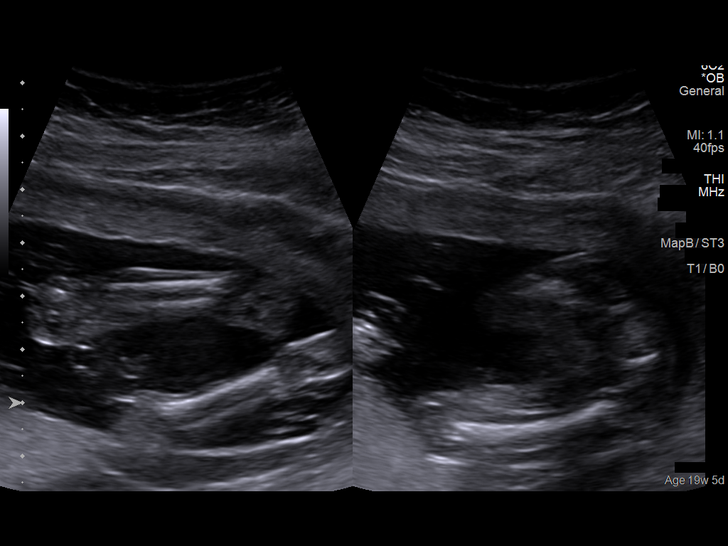
[im 78/96]
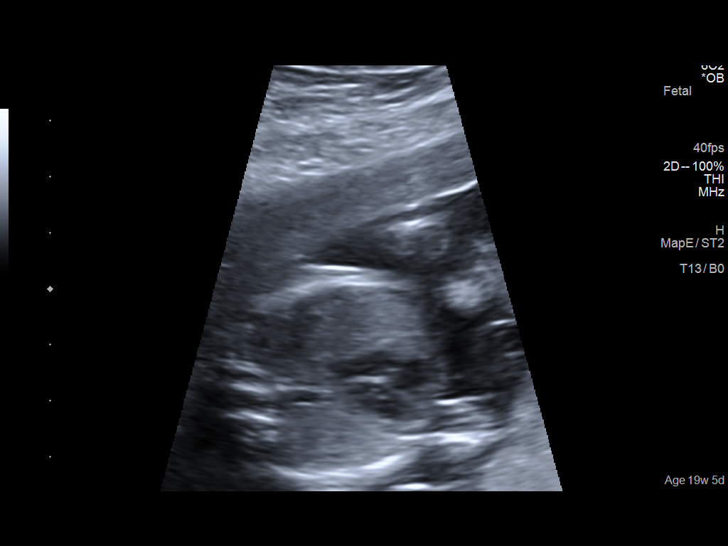
[im 85/96]
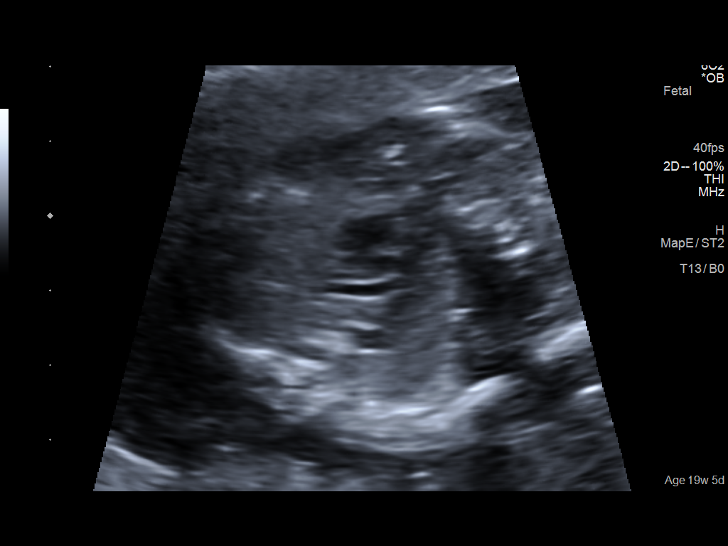
[im 92/96]
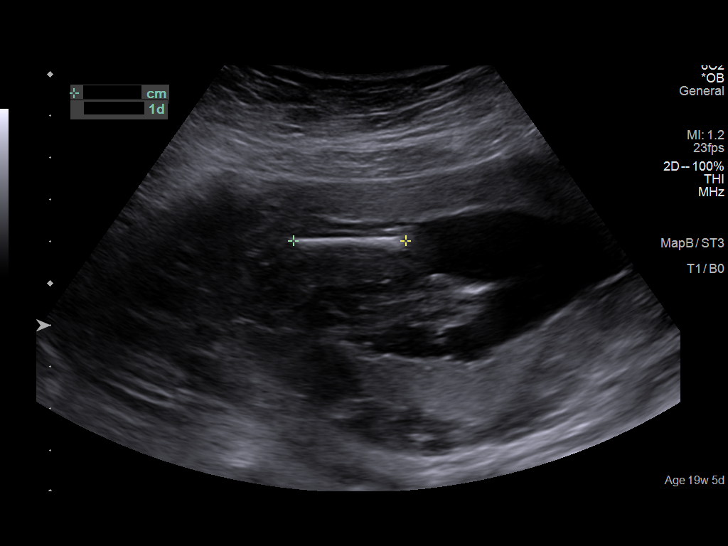

[13 of 28 positions shown; findings below may reference images not displayed]

FINDINGS: Number of Fetuses: 1

Heart Rate:  149 bpm

Movement: Yes

Presentation: Breech

Previa: No

Placental Location: Posterior

Amniotic Fluid (Subjective): Normal

Amniotic Fluid (Objective):

Vertical pocket = 5.1cm

FETAL BIOMETRY

BPD: 4.3cm 19w 0d

HC:   16.1cm 18w 6d

AC:   14.0cm 19w 3d

FL:   2.8cm 18w 3d

Current Mean GA: 19w 0d US EDC: 01/06/2021

GA by LMP: 19w 5d Assigned EDC: 01/01/2021

Estimated Fetal Weight:  264g 11%ile

FETAL ANATOMY

Lateral Ventricles: Appears normal

Thalami/CSP: Appears normal

Posterior Fossa:  Appears normal

Nuchal Region: Appears normal   NFT= 3 mm

Upper Lip: Appears normal

Spine: Appears normal

4 Chamber Heart on Left: Appears normal

LVOT: Appears normal

RVOT: Appears normal

Stomach on Left: Appears normal

3 Vessel Cord: Appears normal

Cord Insertion site: Appears normal

Kidneys: Appears normal

Bladder: Appears normal

Extremities: Appears normal

Technically difficult due to: None.

Maternal Findings:

Cervix:  Closed.  5.2 cm.
IMPRESSION: 1. Single live intrauterine gestation in breech presentation.
Gestational age based on today's measurements is 19 weeks 0 days.
2. Complete fetal anatomic survey.  No fetal anomaly identified.
